# Patient Record
Sex: Female | Born: 1970 | Race: White | Hispanic: No | Marital: Married | Smoking: Never smoker
Health system: Southern US, Community
[De-identification: ages and names within clinical notes are randomized; demographics above are authoritative.]

## PROBLEM LIST (undated history)

## (undated) DIAGNOSIS — Z975 Presence of (intrauterine) contraceptive device: Secondary | ICD-10-CM

## (undated) DIAGNOSIS — E559 Vitamin D deficiency, unspecified: Secondary | ICD-10-CM

## (undated) HISTORY — PX: OTHER SURGICAL HISTORY: SHX169

## (undated) HISTORY — DX: Vitamin D deficiency, unspecified: E55.9

## (undated) HISTORY — DX: Presence of (intrauterine) contraceptive device: Z97.5

---

## 1999-05-21 ENCOUNTER — Other Ambulatory Visit: Admission: RE | Admit: 1999-05-21 | Discharge: 1999-05-21 | Payer: Self-pay | Admitting: Obstetrics and Gynecology

## 2000-06-02 ENCOUNTER — Other Ambulatory Visit: Admission: RE | Admit: 2000-06-02 | Discharge: 2000-06-02 | Payer: Self-pay | Admitting: Obstetrics and Gynecology

## 2001-07-07 ENCOUNTER — Other Ambulatory Visit: Admission: RE | Admit: 2001-07-07 | Discharge: 2001-07-07 | Payer: Self-pay | Admitting: Obstetrics and Gynecology

## 2001-10-13 HISTORY — PX: DILATION AND CURETTAGE OF UTERUS: SHX78

## 2002-07-23 ENCOUNTER — Encounter (INDEPENDENT_AMBULATORY_CARE_PROVIDER_SITE_OTHER): Payer: Self-pay | Admitting: *Deleted

## 2002-07-23 ENCOUNTER — Ambulatory Visit (HOSPITAL_COMMUNITY): Admission: RE | Admit: 2002-07-23 | Discharge: 2002-07-23 | Payer: Self-pay | Admitting: Obstetrics and Gynecology

## 2002-08-11 ENCOUNTER — Other Ambulatory Visit: Admission: RE | Admit: 2002-08-11 | Discharge: 2002-08-11 | Payer: Self-pay | Admitting: Obstetrics and Gynecology

## 2003-06-28 ENCOUNTER — Ambulatory Visit (HOSPITAL_COMMUNITY): Admission: RE | Admit: 2003-06-28 | Discharge: 2003-06-28 | Payer: Self-pay | Admitting: Obstetrics and Gynecology

## 2003-07-10 ENCOUNTER — Encounter: Payer: Self-pay | Admitting: Obstetrics and Gynecology

## 2003-07-10 ENCOUNTER — Ambulatory Visit (HOSPITAL_COMMUNITY): Admission: RE | Admit: 2003-07-10 | Discharge: 2003-07-10 | Payer: Self-pay | Admitting: Obstetrics and Gynecology

## 2003-09-11 ENCOUNTER — Inpatient Hospital Stay (HOSPITAL_COMMUNITY): Admission: AD | Admit: 2003-09-11 | Discharge: 2003-09-14 | Payer: Self-pay | Admitting: Obstetrics and Gynecology

## 2003-10-11 ENCOUNTER — Other Ambulatory Visit: Admission: RE | Admit: 2003-10-11 | Discharge: 2003-10-11 | Payer: Self-pay | Admitting: Obstetrics and Gynecology

## 2004-09-18 ENCOUNTER — Other Ambulatory Visit: Admission: RE | Admit: 2004-09-18 | Discharge: 2004-09-18 | Payer: Self-pay | Admitting: Obstetrics and Gynecology

## 2005-08-06 ENCOUNTER — Other Ambulatory Visit: Admission: RE | Admit: 2005-08-06 | Discharge: 2005-08-06 | Payer: Self-pay | Admitting: Obstetrics and Gynecology

## 2009-11-30 ENCOUNTER — Inpatient Hospital Stay (HOSPITAL_COMMUNITY): Admission: AD | Admit: 2009-11-30 | Discharge: 2009-11-30 | Payer: Self-pay | Admitting: Obstetrics and Gynecology

## 2009-11-30 ENCOUNTER — Ambulatory Visit: Payer: Self-pay | Admitting: Family

## 2009-12-09 ENCOUNTER — Inpatient Hospital Stay (HOSPITAL_COMMUNITY): Admission: AD | Admit: 2009-12-09 | Discharge: 2009-12-11 | Payer: Self-pay | Admitting: Obstetrics and Gynecology

## 2011-01-01 LAB — CBC
HCT: 38.1 % (ref 36.0–46.0)
MCHC: 34 g/dL (ref 30.0–36.0)
MCV: 97.9 fL (ref 78.0–100.0)
MCV: 99.2 fL (ref 78.0–100.0)
Platelets: 212 10*3/uL (ref 150–400)
RBC: 3.89 MIL/uL (ref 3.87–5.11)
RDW: 13.4 % (ref 11.5–15.5)

## 2011-01-01 LAB — RPR: RPR Ser Ql: NONREACTIVE

## 2011-01-01 LAB — RH IMMUNE GLOB WKUP(>/=20WKS)(NOT WOMEN'S HOSP): Fetal Screen: NEGATIVE

## 2011-02-28 NOTE — Op Note (Signed)
NAME:  Susan Powers, Susan Powers                            ACCOUNT NO.:  [id number]   MEDICAL RECORD NO.:  [id number]                   PATIENT TYPE:  AMB   LOCATION:  SDC                                  FACILITY:  [facility]   PHYSICIAN:  Randye Lobo, M.D.                DATE OF BIRTH:  [birth year]   DATE OF PROCEDURE:  07/23/2002  DATE OF DISCHARGE:                                 OPERATIVE REPORT   PREOPERATIVE DIAGNOSES:  1. Intrauterine gestation at 13+ 6 weeks.  2. Missed abortion.  3. Rh-negative status.   POSTOPERATIVE DIAGNOSES:  1. Intrauterine gestation at 13+ 6 weeks.  2. Missed abortion.  3. Rh-negative status.   PROCEDURE:  Dilatation and evacuation.   SURGEON:  Randye Lobo, M.D.   ANESTHESIA:  MAC, paracervical block with 1% lidocaine.   IV FLUIDS:  150 cc of lactated Ringers'.   ESTIMATED BLOOD LOSS:  Minimal.   URINE OUTPUT:  30 cc prior to the procedure.   COMPLICATIONS:  None.   INDICATIONS FOR PROCEDURE:  The patient was a 40 year old gravida 1, para 0,  who presented at 13+ 6 weeks' gestation by her last menstrual period of April 13, 2002, for a routine office visit on July 22, 2002, noting brown  vaginal discharge which has since resolved.  Fetal heart tones were not  present with an external Doppler and an ultrasound was therefore requested  which documented a missed abortion with a crown-rump length of 9+ 5 weeks'  and no fetal heart tones by Doppler ultrasound.  A discussion was held with  the patient regarding her diagnosis and the decision was made to proceed  with the dilatation and evacuation procedure after the risks, benefits, and  alternatives were discussed with her.  The patient's blood type was noted to  O-negative.   FINDINGS:  Examination under anesthesia revealed a 10-week size mid position  uterus.  No adnexal masses were appreciated.   The D&E procedure revealed a large quantity of products of conception which  were sent to  pathology.   SPECIMENS:  Products of conception was sent to pathology.   PROCEDURE:  An IV was started in the Day Surgical area and the patient did  receive Ancef 1 g intravenously.  The patient was then properly identified  and was taken to the operating room suite where MAC anesthesia was induced.  The patient was placed in the dorsal lithotomy position and the vagina and  perineum were then sterilely prepped and draped.  The bladder was sterilely  catheterized of urine.  Examination under anesthesia was performed and the  findings were as noted above.   A speculum was placed inside the vagina and a single-tooth tenaculum was  placed on the anterior cervical lip.  Paracervical block was performed with  a total of 10 cc 1% lidocaine in standard fashion.   The uterus  was sounded and then the cervix was sterilely dilated up to a 25  Pratt dilator.  A 10 suction tip curette was then entered in through the  cervix to the level of the uterine fundus and appropriate suction was  applied.  The suction tip was turned in a clockwise fashion while  withdrawing the curette.  This was repeated an additional three times and a  large amount of products of conception were obtained.  Both a sharp and then  a serrated curette were then used to curette all four quadrants until a  characteristic-like gritty texture was appreciated.  No remaining products  of conception were obtained.  The suction tip curette was introduced one  more time through the cervix to the level of the uterine fundus to remove  any remaining blood clots from within the uterine cavity.  Hemostasis was  noted to be excellent at this time and all vaginal instruments were removed.  The patient was cleansed of Betadine, awakened, and escorted to the recovery  room in stable condition.  There were no complications to the procedure.  All sponge, instrument, and needle counts were correct.                                                   Randye Lobo, M.D.    BES/MEDQ  D:  07/23/2002  T:  07/23/2002  Job:  [id number]

## 2011-05-21 ENCOUNTER — Other Ambulatory Visit: Payer: Self-pay | Admitting: Obstetrics and Gynecology

## 2012-07-15 ENCOUNTER — Encounter: Payer: Self-pay | Admitting: Internal Medicine

## 2012-07-15 ENCOUNTER — Ambulatory Visit (INDEPENDENT_AMBULATORY_CARE_PROVIDER_SITE_OTHER): Payer: BC Managed Care – PPO | Admitting: Internal Medicine

## 2012-07-15 VITALS — BP 120/70 | HR 72 | Temp 97.1°F | Resp 18 | Wt 146.0 lb

## 2012-07-15 DIAGNOSIS — Z87898 Personal history of other specified conditions: Secondary | ICD-10-CM

## 2012-07-15 DIAGNOSIS — M62838 Other muscle spasm: Secondary | ICD-10-CM

## 2012-07-15 DIAGNOSIS — Z23 Encounter for immunization: Secondary | ICD-10-CM

## 2012-07-15 DIAGNOSIS — Z8639 Personal history of other endocrine, nutritional and metabolic disease: Secondary | ICD-10-CM | POA: Insufficient documentation

## 2012-07-15 DIAGNOSIS — B359 Dermatophytosis, unspecified: Secondary | ICD-10-CM

## 2012-07-15 MED ORDER — IBUPROFEN 800 MG PO TABS: ORAL_TABLET | ORAL | Status: DC

## 2012-07-15 NOTE — Progress Notes (Signed)
  Subjective:    Patient ID: Susan Powers, female    DOB: 06-24-1971, 41 y.o.   MRN: [id number]  HPI New pt here for first visit.    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  She brings labs with her and vitamin D level 09/2011 was 29.3   She is concerned over thoracic back pain that  Began last week.  She felt a stinging sensation in upper back and now has tight shoulders and pain wakes her sometimes at nifght.  Had massage yesterday that helped n.  No numbness, tingling or weakness in arms.  She exercises frequetly with spin and running   No Known Allergies History reviewed. No pertinent past medical history. No past surgical history on file. History   Social History  . Marital Status: Married    Spouse Name: N/A    Number of Children: N/A  . Years of Education: N/A   Occupational History  . Not on file.   Social History Main Topics  . Smoking status: Never Smoker   . Smokeless tobacco: Not on file  . Alcohol Use: 0.5 - 1.0 oz/week    1-2 drink(s) per week  . Drug Use: No  . Sexually Active: Yes    Birth Control/ Protection: IUD   Other Topics Concern  . Not on file   Social History Narrative  . No narrative on file   Family History  Problem Relation Age of Onset  . Arthritis Mother   . Arthritis Paternal Grandmother    There is no problem list on file for this patient.  No current outpatient prescriptions on file prior to visit.      Review of Systems    see HPI Objective:   Physical Exam Physical Exam  Nursing note and vitals reviewed.  Constitutional: She is oriented to person, place, and time. She appears well-developed and well-nourished.  HENT:  Head: Normocephalic and atraumatic.  Cardiovascular: Normal rate and regular rhythm. Exam reveals no gallop and no friction rub.  No murmur heard.  Pulmonary/Chest:  Breath sounds normal. She has no wheezes. She has no rales. BAck  Muslce stiffness R thoracic region.    Neurological: She is alert and oriented to person, place, and time.  Skin: Skin is warm and dry.  Psychiatric: She has a normal mood and affect. Her behavior is normal.         Assessment & Plan:  History of Vitamin D deficiency:  Counseled of dosing of calcium and vitamin d daily  Thoracic muscle strain  Ibuprofen 800 mg bid for one week  Ice/Heat  Avoid upper body exercises for now  Schedule CPE  Influenza today

## 2012-07-15 NOTE — Patient Instructions (Addendum)
Take ibuprofen twice a day 800 mg for one week  Call if back not better  Schedule complete physical

## 2012-07-30 ENCOUNTER — Encounter: Payer: Self-pay | Admitting: *Deleted

## 2012-10-18 ENCOUNTER — Encounter: Payer: Self-pay | Admitting: Internal Medicine

## 2012-10-18 ENCOUNTER — Ambulatory Visit (INDEPENDENT_AMBULATORY_CARE_PROVIDER_SITE_OTHER): Payer: BC Managed Care – PPO | Admitting: Internal Medicine

## 2012-10-18 VITALS — BP 114/74 | HR 77 | Temp 97.2°F | Resp 18 | Wt 148.0 lb

## 2012-10-18 DIAGNOSIS — Z124 Encounter for screening for malignant neoplasm of cervix: Secondary | ICD-10-CM

## 2012-10-18 DIAGNOSIS — K469 Unspecified abdominal hernia without obstruction or gangrene: Secondary | ICD-10-CM | POA: Insufficient documentation

## 2012-10-18 DIAGNOSIS — E559 Vitamin D deficiency, unspecified: Secondary | ICD-10-CM

## 2012-10-18 DIAGNOSIS — Z0000 Encounter for general adult medical examination without abnormal findings: Secondary | ICD-10-CM

## 2012-10-18 DIAGNOSIS — R1033 Periumbilical pain: Secondary | ICD-10-CM

## 2012-10-18 DIAGNOSIS — Z1151 Encounter for screening for human papillomavirus (HPV): Secondary | ICD-10-CM

## 2012-10-18 DIAGNOSIS — F338 Other recurrent depressive disorders: Secondary | ICD-10-CM | POA: Insufficient documentation

## 2012-10-18 DIAGNOSIS — F39 Unspecified mood [affective] disorder: Secondary | ICD-10-CM

## 2012-10-18 DIAGNOSIS — K429 Umbilical hernia without obstruction or gangrene: Secondary | ICD-10-CM

## 2012-10-18 LAB — COMPREHENSIVE METABOLIC PANEL
ALT: 12 U/L (ref 0–35)
AST: 15 U/L (ref 0–37)
Albumin: 4.8 g/dL (ref 3.5–5.2)
Alkaline Phosphatase: 49 U/L (ref 39–117)
BUN: 11 mg/dL (ref 6–23)
CO2: 29 mEq/L (ref 19–32)
Calcium: 9.4 mg/dL (ref 8.4–10.5)
Glucose, Bld: 75 mg/dL (ref 70–99)
Total Protein: 7.1 g/dL (ref 6.0–8.3)

## 2012-10-18 LAB — LIPID PANEL
Cholesterol: 150 mg/dL (ref 0–200)
HDL: 42 mg/dL (ref >39)

## 2012-10-18 LAB — CBC WITH DIFFERENTIAL/PLATELET
Basophils Absolute: 0 10*3/uL (ref 0.0–0.1)
Eosinophils Relative: 2 % (ref 0–5)
HCT: 41.7 % (ref 36.0–46.0)
Hemoglobin: 14.3 g/dL (ref 12.0–15.0)
Lymphs Abs: 2.4 10*3/uL (ref 0.7–4.0)
MCHC: 34.3 g/dL (ref 30.0–36.0)
MCV: 93.7 fL (ref 78.0–100.0)
Monocytes Absolute: 0.3 10*3/uL (ref 0.1–1.0)
RBC: 4.45 MIL/uL (ref 3.87–5.11)
RDW: 12.8 % (ref 11.5–15.5)
WBC: 6.4 10*3/uL (ref 4.0–10.5)

## 2012-10-18 LAB — POCT URINALYSIS DIPSTICK
Blood, UA: NEGATIVE
Leukocytes, UA: NEGATIVE
Nitrite, UA: NEGATIVE
Protein, UA: NEGATIVE
Spec Grav, UA: <=1.005
Urobilinogen, UA: NEGATIVE

## 2012-10-18 MED ORDER — BUPROPION HCL ER (SR) 100 MG PO TB12: ORAL_TABLET | ORAL | Status: DC

## 2012-10-18 NOTE — Progress Notes (Signed)
Subjective:    Patient ID: Susan Powers, female    DOB: 1971-09-11, 42 y.o.   MRN: [id number]  HPI Susan Powers is here for CPE.  Reports her back is much better  She reports she has a hernia bulge that she feels near her belly button.  Bulge moves in and out easily but at times she feels pain.  No obvious bulging today no pain today.  LBM yesterday  Passing flatus  She also reports she has been feeling "blue"  No energy, sad most days,  No motivation.  Negative for anhedonia,  Concentration,  appetitie ok  Wants to sleep all the time .  She notes this has happened every winter for the last 3 winters.    Tearful when describing her sad mood.  She denies S/H ideation and no psychotic features.  NO FH of depression  No Known Allergies History reviewed. No pertinent past medical history. History reviewed. No pertinent past surgical history. History   Social History  . Marital Status: Married    Spouse Name: N/A    Number of Children: N/A  . Years of Education: N/A   Occupational History  . Not on file.   Social History Main Topics  . Smoking status: Never Smoker   . Smokeless tobacco: Not on file  . Alcohol Use: 0.5 - 1.0 oz/week    1-2 drink(s) per week  . Drug Use: No  . Sexually Active: Yes    Birth Control/ Protection: IUD   Other Topics Concern  . Not on file   Social History Narrative  . No narrative on file   Family History  Problem Relation Age of Onset  . Arthritis Mother   . Arthritis Paternal Grandmother    Patient Active Problem List  Diagnosis  . H/O vitamin D deficiency  . Muscle spasm  . Tinea  . Seasonal affective disorder  . Abdominal hernia   Current Outpatient Prescriptions on File Prior to Visit  Medication Sig Dispense Refill  . buPROPion (WELLBUTRIN SR) 100 MG 12 hr tablet Take one tablet daily  30 tablet  2  . ibuprofen (ADVIL,MOTRIN) 800 MG tablet One po bid for one week  21 tablet  0       Review of Systems  Constitutional: Negative.     HENT: Negative.   Eyes: Negative.   Respiratory: Negative.   Cardiovascular: Negative.   Gastrointestinal: Negative.   Genitourinary: Negative.   Musculoskeletal: Negative.   Skin: Negative.   Neurological: Negative.   Hematological: Negative.   Psychiatric/Behavioral: Positive for dysphoric mood. Negative for suicidal ideas and self-injury.      see HPI   Objective:   Physical Exam  Physical Exam  Vital signs and nursing note reviewed  Constitutional: She is oriented to person, place, and time. She appears well-developed and well-nourished. She is cooperative.  HENT:  Head: Normocephalic and atraumatic.  Right Ear: Tympanic membrane normal.  Left Ear: Tympanic membrane normal.  Nose: Nose normal.  Mouth/Throat: Oropharynx is clear and moist and mucous membranes are normal. No oropharyngeal exudate or posterior oropharyngeal erythema.  Eyes: Conjunctivae and EOM are normal. Pupils are equal, round, and reactive to light.  Neck: Neck supple. No JVD present. Carotid bruit is not present. No mass and no thyromegaly present.  Cardiovascular: Regular rhythm, normal heart sounds, intact distal pulses and normal pulses.  Exam reveals no gallop and no friction rub.   No murmur heard. Pulses:      Dorsalis pedis pulses  are 2+ on the right side, and 2+ on the left side.  Pulmonary/Chest: Breath sounds normal. She has no wheezes. She has no rhonchi. She has no rales. Right breast exhibits no mass, no nipple discharge and no skin change. Left breast exhibits no mass, no nipple discharge and no skin change.  Abdominal: Soft. Bowel sounds are normal. She exhibits no distension and no mass. There is no hepatosplenomegaly. There is no tenderness. There is no CVA tenderness.  Genitourinary: Rectum normal, vagina normal and uterus normal. Rectal exam shows no mass. No labial fusion. There is no lesion on the right labia. There is no lesion on the left labia. Cervix exhibits no motion tenderness.  Right adnexum displays no mass, no tenderness and no fullness. Left adnexum displays no mass, no tenderness and no fullness. No erythema around the vagina.  Musculoskeletal:       No active synovitis to any joint.    Lymphadenopathy:       Right cervical: No superficial cervical adenopathy present.      Left cervical: No superficial cervical adenopathy present.       Right axillary: No pectoral and no lateral adenopathy present.       Left axillary: No pectoral and no lateral adenopathy present.      Right: No inguinal adenopathy present.       Left: No inguinal adenopathy present.  Neurological: She is alert and oriented to person, place, and time. She has normal strength and normal reflexes. No cranial nerve deficit or sensory deficit. She displays a negative Romberg sign. Coordination and gait normal.  Skin: Skin is warm and dry. No abrasion, no bruising, no ecchymosis and no rash noted. No cyanosis. Nails show no clubbing.  Psychiatric: She has a normal mood and affect. Her speech is normal and behavior is normal.          Assessment & Plan:  Uuuuuuu  Health Maintenance\ Up to date with vaccines.  Mammogram done 02/13/2012 [place] comprehensive. Pap today.  See scanned sheet  abd wall hernia:  Will get CT abd/pelvis with contrast  Seasonal affective disorder  Pt desires to try a med.  Will give Wellbutrin 100 mg SR daily.  She is to see me in 4-6 weeks.  Discussed SE profle.  No history of seizure disorder.  Vitamin D deficiency will check today  Back strain  improved       Assessment & Plan:

## 2012-10-18 NOTE — Patient Instructions (Addendum)
See me in 4-6 weeks

## 2012-10-19 ENCOUNTER — Ambulatory Visit (HOSPITAL_BASED_OUTPATIENT_CLINIC_OR_DEPARTMENT_OTHER): Payer: BC Managed Care – PPO

## 2012-10-19 ENCOUNTER — Telehealth: Payer: Self-pay | Admitting: *Deleted

## 2012-10-19 ENCOUNTER — Encounter: Payer: Self-pay | Admitting: *Deleted

## 2012-10-19 LAB — VITAMIN D 25 HYDROXY (VIT D DEFICIENCY, FRACTURES): Vit D, 25-Hydroxy: 31 ng/mL (ref 30–89)

## 2012-10-26 ENCOUNTER — Encounter: Payer: Self-pay | Admitting: *Deleted

## 2012-10-28 NOTE — Telephone Encounter (Signed)
error 

## 2012-11-19 ENCOUNTER — Encounter: Payer: Self-pay | Admitting: *Deleted

## 2012-11-22 ENCOUNTER — Encounter: Payer: Self-pay | Admitting: Internal Medicine

## 2012-11-22 ENCOUNTER — Ambulatory Visit (INDEPENDENT_AMBULATORY_CARE_PROVIDER_SITE_OTHER): Payer: BC Managed Care – PPO | Admitting: Internal Medicine

## 2012-11-22 VITALS — BP 113/75 | HR 82 | Temp 97.7°F | Resp 18 | Wt 148.0 lb

## 2012-11-22 DIAGNOSIS — K469 Unspecified abdominal hernia without obstruction or gangrene: Secondary | ICD-10-CM

## 2012-11-22 DIAGNOSIS — F39 Unspecified mood [affective] disorder: Secondary | ICD-10-CM

## 2012-11-22 DIAGNOSIS — F338 Other recurrent depressive disorders: Secondary | ICD-10-CM

## 2012-11-22 NOTE — Patient Instructions (Addendum)
See me as needed 

## 2012-11-22 NOTE — Progress Notes (Signed)
  Subjective:    Patient ID: Susan Powers, female    DOB: [birth year], 42 y.o.   MRN: [id number]  HPI Halo is here for follow up.    SAD:  She reports 60-70% improvement with Wellbutrin.  Mood brighter, concentration better, more focused No S/H ideation  Umbilical hernia.  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 .  No pain ,  Normal pattern to BM's  She would eventually like a surgeon to evaluate her but wants to check with insurance first  No Known Allergies History reviewed. No pertinent past medical history. History reviewed. No pertinent past surgical history. History   Social History  . Marital Status: Married    Spouse Name: N/A    Number of Children: N/A  . Years of Education: N/A   Occupational History  . Not on file.   Social History Main Topics  . Smoking status: Never Smoker   . Smokeless tobacco: Not on file  . Alcohol Use: .5 - 1 oz/week    1-2 drink(s) per week  . Drug Use: No  . Sexually Active: Yes    Birth Control/ Protection: IUD   Other Topics Concern  . Not on file   Social History Narrative  . No narrative on file   Family History  Problem Relation Age of Onset  . Arthritis Mother   . Arthritis Paternal Grandmother    Patient Active Problem List  Diagnosis  . H/O vitamin D deficiency  . Muscle spasm  . Tinea  . Seasonal affective disorder  . Abdominal hernia   Current Outpatient Prescriptions on File Prior to Visit  Medication Sig Dispense Refill  . buPROPion (WELLBUTRIN SR) 100 MG 12 hr tablet Take one tablet daily  30 tablet  2  . ibuprofen (ADVIL,MOTRIN) 800 MG tablet One po bid for one week  21 tablet  0   No current facility-administered medications on file prior to visit.          Review of Systems See HPI    Objective:   Physical Exam  Physical Exam  Nursing note and vitals reviewed.  Constitutional: She is oriented to person, place, and time. She appears well-developed and  well-nourished.  HENT:  Head: Normocephalic and atraumatic.  Cardiovascular: Normal rate and regular rhythm. Exam reveals no gallop and no friction rub.  No murmur heard.  Pulmonary/Chest: Breath sounds normal. She has no wheezes. She has no rales.  Neurological: She is alert and oriented to person, place, and time.  Skin: Skin is warm and dry.  Psychiatric: She has a normal mood and affect. Her behavior is normal.            Assessment & Plan:  SAD:  Continue WEllbutrin for now.  Re-evaluarte in 6 months.    Umbilical hernia  Ok for surgical referral but pt wishes to check with insurance first .  She will calloffice when ready.  Counsleed if any pain or change in bowel pattern to call office

## 2013-02-14 ENCOUNTER — Encounter: Payer: Self-pay | Admitting: *Deleted

## 2013-03-04 ENCOUNTER — Other Ambulatory Visit: Payer: Self-pay | Admitting: Internal Medicine

## 2013-03-04 NOTE — Telephone Encounter (Signed)
Refill request

## 2013-04-11 ENCOUNTER — Ambulatory Visit: Payer: BC Managed Care – PPO | Admitting: Internal Medicine

## 2013-04-12 ENCOUNTER — Ambulatory Visit (INDEPENDENT_AMBULATORY_CARE_PROVIDER_SITE_OTHER): Payer: BC Managed Care – PPO | Admitting: Internal Medicine

## 2013-04-12 ENCOUNTER — Encounter: Payer: Self-pay | Admitting: Internal Medicine

## 2013-04-12 VITALS — BP 105/63 | HR 87 | Temp 97.9°F | Resp 18 | Wt 149.0 lb

## 2013-04-12 DIAGNOSIS — F4323 Adjustment disorder with mixed anxiety and depressed mood: Secondary | ICD-10-CM | POA: Insufficient documentation

## 2013-04-12 DIAGNOSIS — L304 Erythema intertrigo: Secondary | ICD-10-CM

## 2013-04-12 DIAGNOSIS — L538 Other specified erythematous conditions: Secondary | ICD-10-CM

## 2013-04-12 MED ORDER — NYSTATIN-TRIAMCINOLONE 100000-0.1 UNIT/GM-% EX OINT: TOPICAL_OINTMENT | Freq: Two times a day (BID) | CUTANEOUS | Status: DC

## 2013-04-12 NOTE — Patient Instructions (Addendum)
See me as needed 

## 2013-04-12 NOTE — Progress Notes (Signed)
  Subjective:    Patient ID: Susan Powers, female    DOB: 01/30/1971, 42 y.o.   MRN: [id number]  HPI  [name] is here for follow up.  She has been on Wellbutrin for 6 months now.  She tells me that when she forgets to take her med she can feel her sad symptoms return  No SI/HI  She also has itchy rash top of both thighs.  No vaginal discharge  No Known Allergies History reviewed. No pertinent past medical history. History reviewed. No pertinent past surgical history. History   Social History  . Marital Status: Married    Spouse Name: N/A    Number of Children: N/A  . Years of Education: N/A   Occupational History  . Not on file.   Social History Main Topics  . Smoking status: Never Smoker   . Smokeless tobacco: Not on file  . Alcohol Use: .5 - 1 oz/week    1-2 drink(s) per week  . Drug Use: No  . Sexually Active: Yes    Birth Control/ Protection: IUD   Other Topics Concern  . Not on file   Social History Narrative  . No narrative on file   Family History  Problem Relation Age of Onset  . Arthritis Mother   . Arthritis Paternal Grandmother    Patient Active Problem List   Diagnosis Date Noted  . Adjustment disorder with mixed anxiety and depressed mood 04/12/2013  . Seasonal affective disorder 10/18/2012  . Abdominal hernia 10/18/2012  . H/O vitamin D deficiency 07/15/2012  . Muscle spasm 07/15/2012  . Tinea 07/15/2012   Current Outpatient Prescriptions on File Prior to Visit  Medication Sig Dispense Refill  . buPROPion (WELLBUTRIN SR) 100 MG 12 hr tablet TAKE 1 TABLET BY MOUTH ONCE DAILY  30 tablet  2  . ibuprofen (ADVIL,MOTRIN) 800 MG tablet One po bid for one week  21 tablet  0   No current facility-administered medications on file prior to visit.      Review of Systems See HPI    Objective:   Physical Exam Physical Exam  Nursing note and vitals reviewed.  Constitutional: She is oriented to person, place, and time. She appears well-developed and  well-nourished.  HENT:  Head: Normocephalic and atraumatic.  Cardiovascular: Normal rate and regular rhythm. Exam reveals no gallop and no friction rub.  No murmur heard.  Pulmonary/Chest: Breath sounds normal. She has no wheezes. She has no rales.  Neurological: She is alert and oriented to person, place, and time.  No tremor Skin: Skin is warm and dry. Perineal area  She has reddened rash with satellite lesions at top of thighs EXT:  No  edema  Psychiatric: She has a normal mood and affect. Her behavior is normal.        Assessment & Plan:  Adjustment disorder with depressed mood:  Will continue Wellbutrin 100 mg.  Intertrigo:  Will give Mycolog.  Bid  See me as needed

## 2013-06-15 ENCOUNTER — Other Ambulatory Visit: Payer: Self-pay | Admitting: Internal Medicine

## 2013-06-17 ENCOUNTER — Other Ambulatory Visit: Payer: Self-pay | Admitting: *Deleted

## 2013-06-17 NOTE — Telephone Encounter (Signed)
Pt requesting refill

## 2013-06-19 MED ORDER — NYSTATIN-TRIAMCINOLONE 100000-0.1 UNIT/GM-% EX OINT: TOPICAL_OINTMENT | Freq: Two times a day (BID) | CUTANEOUS | Status: DC

## 2013-09-01 ENCOUNTER — Other Ambulatory Visit: Payer: Self-pay | Admitting: *Deleted

## 2013-09-01 DIAGNOSIS — Z139 Encounter for screening, unspecified: Secondary | ICD-10-CM

## 2013-09-01 DIAGNOSIS — R922 Inconclusive mammogram: Secondary | ICD-10-CM

## 2013-09-19 ENCOUNTER — Encounter: Payer: Self-pay | Admitting: Internal Medicine

## 2013-09-19 ENCOUNTER — Ambulatory Visit (INDEPENDENT_AMBULATORY_CARE_PROVIDER_SITE_OTHER): Payer: BC Managed Care – PPO | Admitting: Internal Medicine

## 2013-09-19 VITALS — BP 117/77 | HR 82 | Temp 97.6°F | Resp 18

## 2013-09-19 DIAGNOSIS — F338 Other recurrent depressive disorders: Secondary | ICD-10-CM

## 2013-09-19 DIAGNOSIS — F39 Unspecified mood [affective] disorder: Secondary | ICD-10-CM

## 2013-09-19 DIAGNOSIS — T753XXA Motion sickness, initial encounter: Secondary | ICD-10-CM

## 2013-09-19 DIAGNOSIS — F4323 Adjustment disorder with mixed anxiety and depressed mood: Secondary | ICD-10-CM

## 2013-09-19 MED ORDER — PROMETHAZINE HCL 25 MG PO TABS: 25.0000 mg | ORAL_TABLET | Freq: Three times a day (TID) | ORAL | Status: DC | PRN

## 2013-09-19 MED ORDER — SCOPOLAMINE 1 MG/3DAYS TD PT72: 1.0000 | MEDICATED_PATCH | TRANSDERMAL | Status: DC

## 2013-09-19 MED ORDER — BUPROPION HCL ER (SR) 100 MG PO TB12: ORAL_TABLET | ORAL | Status: DC

## 2013-09-19 NOTE — Patient Instructions (Signed)
See me as needed 

## 2013-09-19 NOTE — Progress Notes (Signed)
   Subjective:    Patient ID: Susan Powers, female    DOB: 10/21/1970, 42 y.o.   MRN: [id number]  HPI  Susan Powers is here for acute visit.  She is going on a cruise and would like to have a few scopolamine patches and phenergan tablets as she gets severe motion sickness        She tells me her wellbutrin is helping well  And she would like to continue  No Known Allergies History reviewed. No pertinent past medical history. History reviewed. No pertinent past surgical history. History   Social History  . Marital Status: Married    Spouse Name: N/A    Number of Children: N/A  . Years of Education: N/A   Occupational History  . Not on file.   Social History Main Topics  . Smoking status: Never Smoker   . Smokeless tobacco: Not on file  . Alcohol Use: .5 - 1 oz/week    1-2 drink(s) per week  . Drug Use: No  . Sexual Activity: Yes    Birth Control/ Protection: IUD   Other Topics Concern  . Not on file   Social History Narrative  . No narrative on file   Family History  Problem Relation Age of Onset  . Arthritis Mother   . Arthritis Paternal Grandmother    Patient Active Problem List   Diagnosis Date Noted  . Adjustment disorder with mixed anxiety and depressed mood 04/12/2013  . Seasonal affective disorder 10/18/2012  . Abdominal hernia 10/18/2012  . H/O vitamin D deficiency 07/15/2012  . Muscle spasm 07/15/2012  . Tinea 07/15/2012   Current Outpatient Prescriptions on File Prior to Visit  Medication Sig Dispense Refill  . buPROPion (WELLBUTRIN SR) 100 MG 12 hr tablet TAKE 1 TABLET BY MOUTH ONCE DAILY  30 tablet  2  . ibuprofen (ADVIL,MOTRIN) 800 MG tablet One po bid for one week  21 tablet  0  . nystatin-triamcinolone ointment (MYCOLOG) Apply topically 2 (two) times daily.  30 g  0   No current facility-administered medications on file prior to visit.      Review of Systems See HPI    Objective:   Physical Exam  Physical Exam  Nursing note and vitals  reviewed.  Constitutional: She is oriented to person, place, and time. She appears well-developed and well-nourished.  HENT:  Head: Normocephalic and atraumatic.  Cardiovascular: Normal rate and regular rhythm. Exam reveals no gallop and no friction rub.  No murmur heard.  Pulmonary/Chest: Breath sounds normal. She has no wheezes. She has no rales.  Neurological: She is alert and oriented to person, place, and time.  Skin: Skin is warm and dry.  Psychiatric: She has a normal mood and affect. Her behavior is normal.       Assessment & Plan:  Seasonal  Affective disorder.  Continue Wellbutrin  Motion sickness  :  OK to have a few scopolamine patches and phenergan  Advised to try OTC dramamine begin 24 hours prior to get on boat.   Advised not to take dramamine with scopolamine togetther

## 2013-10-14 ENCOUNTER — Other Ambulatory Visit: Payer: Self-pay | Admitting: Internal Medicine

## 2013-10-20 ENCOUNTER — Other Ambulatory Visit: Payer: Self-pay | Admitting: *Deleted

## 2013-10-20 MED ORDER — NYSTATIN 100000 UNIT/GM EX CREA: 1.0000 "application " | TOPICAL_CREAM | Freq: Two times a day (BID) | CUTANEOUS | Status: DC

## 2013-10-20 MED ORDER — TRIAMCINOLONE ACETONIDE 0.1 % EX CREA: 1.0000 "application " | TOPICAL_CREAM | Freq: Two times a day (BID) | CUTANEOUS | Status: DC

## 2013-10-20 NOTE — Telephone Encounter (Signed)
Refill request

## 2013-11-09 ENCOUNTER — Ambulatory Visit
Admission: RE | Admit: 2013-11-09 | Discharge: 2013-11-09 | Disposition: A | Discharge status: Home / Self Care | Payer: BC Managed Care – PPO | Source: Ambulatory Visit | Attending: Internal Medicine | Admitting: Internal Medicine

## 2013-11-09 DIAGNOSIS — Z139 Encounter for screening, unspecified: Secondary | ICD-10-CM

## 2013-11-09 DIAGNOSIS — R922 Inconclusive mammogram: Secondary | ICD-10-CM

## 2013-12-02 ENCOUNTER — Other Ambulatory Visit: Payer: Self-pay | Admitting: *Deleted

## 2013-12-02 DIAGNOSIS — Z0000 Encounter for general adult medical examination without abnormal findings: Secondary | ICD-10-CM

## 2013-12-05 LAB — CBC WITH DIFFERENTIAL/PLATELET
BASOS PCT: 0 % (ref 0–1)
Basophils Absolute: 0 10*3/uL (ref 0.0–0.1)
Eosinophils Absolute: 0.1 10*3/uL (ref 0.0–0.7)
Eosinophils Relative: 2 % (ref 0–5)
HCT: 40.5 % (ref 36.0–46.0)
HEMOGLOBIN: 13.8 g/dL (ref 12.0–15.0)
Lymphocytes Relative: 34 % (ref 12–46)
Lymphs Abs: 2.2 10*3/uL (ref 0.7–4.0)
MCH: 32.3 pg (ref 26.0–34.0)
MCHC: 34.1 g/dL (ref 30.0–36.0)
MCV: 94.8 fL (ref 78.0–100.0)
Monocytes Absolute: 0.4 10*3/uL (ref 0.1–1.0)
Monocytes Relative: 6 % (ref 3–12)
NEUTROS ABS: 3.7 10*3/uL (ref 1.7–7.7)
NEUTROS PCT: 58 % (ref 43–77)
Platelets: 235 10*3/uL (ref 150–400)
RBC: 4.27 MIL/uL (ref 3.87–5.11)
RDW: 12.9 % (ref 11.5–15.5)
WBC: 6.4 10*3/uL (ref 4.0–10.5)

## 2013-12-06 ENCOUNTER — Encounter: Payer: Self-pay | Admitting: *Deleted

## 2013-12-06 LAB — COMPREHENSIVE METABOLIC PANEL
ALBUMIN: 4.2 g/dL (ref 3.5–5.2)
ALT: 9 U/L (ref 0–35)
AST: 13 U/L (ref 0–37)
Alkaline Phosphatase: 49 U/L (ref 39–117)
BUN: 12 mg/dL (ref 6–23)
CALCIUM: 8.7 mg/dL (ref 8.4–10.5)
CHLORIDE: 103 meq/L (ref 96–112)
CO2: 24 meq/L (ref 19–32)
Creat: 0.69 mg/dL (ref 0.50–1.10)
Glucose, Bld: 89 mg/dL (ref 70–99)
POTASSIUM: 4 meq/L (ref 3.5–5.3)
Sodium: 137 mEq/L (ref 135–145)
Total Bilirubin: 0.5 mg/dL (ref 0.2–1.2)
Total Protein: 6.6 g/dL (ref 6.0–8.3)

## 2013-12-06 LAB — LIPID PANEL
CHOLESTEROL: 119 mg/dL (ref 0–200)
HDL: 37 mg/dL — ABNORMAL LOW (ref >39)
LDL Cholesterol: 69 mg/dL (ref 0–99)
Total CHOL/HDL Ratio: 3.2 Ratio
Triglycerides: 64 mg/dL (ref <150)
VLDL: 13 mg/dL (ref 0–40)

## 2013-12-06 LAB — VITAMIN D 25 HYDROXY (VIT D DEFICIENCY, FRACTURES): Vit D, 25-Hydroxy: 37 ng/mL (ref 30–89)

## 2013-12-06 LAB — TSH: TSH: 2.078 u[IU]/mL (ref 0.350–4.500)

## 2013-12-07 ENCOUNTER — Ambulatory Visit (INDEPENDENT_AMBULATORY_CARE_PROVIDER_SITE_OTHER): Payer: BC Managed Care – PPO | Admitting: Internal Medicine

## 2013-12-07 ENCOUNTER — Encounter: Payer: Self-pay | Admitting: Internal Medicine

## 2013-12-07 VITALS — BP 123/72 | HR 87 | Resp 18 | Ht 67.0 in | Wt 154.0 lb

## 2013-12-07 DIAGNOSIS — Z0000 Encounter for general adult medical examination without abnormal findings: Secondary | ICD-10-CM

## 2013-12-07 DIAGNOSIS — M62838 Other muscle spasm: Secondary | ICD-10-CM

## 2013-12-07 DIAGNOSIS — E559 Vitamin D deficiency, unspecified: Secondary | ICD-10-CM

## 2013-12-07 DIAGNOSIS — F39 Unspecified mood [affective] disorder: Secondary | ICD-10-CM

## 2013-12-07 DIAGNOSIS — F4323 Adjustment disorder with mixed anxiety and depressed mood: Secondary | ICD-10-CM

## 2013-12-07 DIAGNOSIS — F338 Other recurrent depressive disorders: Secondary | ICD-10-CM

## 2013-12-07 DIAGNOSIS — R21 Rash and other nonspecific skin eruption: Secondary | ICD-10-CM

## 2013-12-07 MED ORDER — FLUCONAZOLE 150 MG PO TABS: ORAL_TABLET | ORAL | Status: DC

## 2013-12-07 MED ORDER — NABUMETONE 500 MG PO TABS
ORAL_TABLET | ORAL | Status: DC
Start: 2013-12-07 — End: 2014-07-19

## 2013-12-07 NOTE — Patient Instructions (Signed)
Will set up referral  To dermatology   Go to Pharmacy today  Take medicine as prescribed    See me in 3-4 weeks

## 2013-12-07 NOTE — Progress Notes (Signed)
Subjective:    Patient ID: Susan Powers, female    DOB: 09/05/1971, 43 y.o.   MRN: [id number]  HPI Susan Powers is here for CPE  Reports she has pain and stiffness in muscles of both shoulders  Upper trapezius area.   No pain in shoulders or along clavicle.    Denies injury or trauma.   Does lots of computer work  She still has rash inner thighs that itches .  Using Mycolog  Bid.     Wears cotton underwear.   No Known Allergies History reviewed. No pertinent past medical history. History reviewed. No pertinent past surgical history. History   Social History  . Marital Status: Married    Spouse Name: N/A    Number of Children: N/A  . Years of Education: N/A   Occupational History  . Not on file.   Social History Main Topics  . Smoking status: Never Smoker   . Smokeless tobacco: Not on file  . Alcohol Use: .5 - 1 oz/week    1-2 drink(s) per week  . Drug Use: No  . Sexual Activity: Yes    Birth Control/ Protection: IUD   Other Topics Concern  . Not on file   Social History Narrative  . No narrative on file   Family History  Problem Relation Age of Onset  . Arthritis Mother   . Arthritis Paternal Grandmother    Patient Active Problem List   Diagnosis Date Noted  . Adjustment disorder with mixed anxiety and depressed mood 04/12/2013  . Seasonal affective disorder 10/18/2012  . Abdominal hernia 10/18/2012  . H/O vitamin D deficiency 07/15/2012  . Muscle spasm 07/15/2012  . Tinea 07/15/2012   Current Outpatient Prescriptions on File Prior to Visit  Medication Sig Dispense Refill  . buPROPion (WELLBUTRIN SR) 100 MG 12 hr tablet Take one tablet twice a day  60 tablet  5  . ibuprofen (ADVIL,MOTRIN) 800 MG tablet One po bid for one week  21 tablet  0  . nystatin cream (MYCOSTATIN) Apply 1 application topically 2 (two) times daily.  30 g  0  . triamcinolone cream (KENALOG) 0.1 % Apply 1 application topically 2 (two) times daily.  30 g  0   No current facility-administered  medications on file prior to visit.       Review of Systems See HPI    Objective:   Physical Exam Physical Exam  Nursing note and vitals reviewed.  Constitutional: She is oriented to person, place, and time. She appears well-developed and well-nourished.  HENT:  Head: Normocephalic and atraumatic.  Right Ear: Tympanic membrane and ear canal normal. No drainage. Tympanic membrane is not injected and not erythematous.  Left Ear: Tympanic membrane and ear canal normal. No drainage. Tympanic membrane is not injected and not erythematous.  Nose: Nose normal. Right sinus exhibits no maxillary sinus tenderness and no frontal sinus tenderness. Left sinus exhibits no maxillary sinus tenderness and no frontal sinus tenderness.  Mouth/Throat: Oropharynx is clear and moist. No oral lesions. No oropharyngeal exudate.  Eyes: Conjunctivae and EOM are normal. Pupils are equal, round, and reactive to light.  Neck: Normal range of motion. Neck supple. No JVD present. Carotid bruit is not present. No mass and no thyromegaly present.  Cardiovascular: Normal rate, regular rhythm, S1 normal, S2 normal and intact distal pulses. Exam reveals no gallop and no friction rub.  No murmur heard.  Pulses:  Carotid pulses are 2+ on the right side, and 2+ on the  left side.  Dorsalis pedis pulses are 2+ on the right side, and 2+ on the left side.  No carotid bruit. No LE edema  Pulmonary/Chest: Breath sounds normal. She has no wheezes. She has no rales. She exhibits no tenderness.   Breast no discrete mass no nipple discharge no axillary adenopathy bilaterally Abdominal: Soft. Bowel sounds are normal. She exhibits no distension and no mass. There is no hepatosplenomegaly. There is no tenderness. There is no CVA tenderness.  Musculoskeletal: Normal range of motion.  No active synovitis to joints.  Stiffness to upper trapezius muscles. Good ROm and nontender in shoulders Lymphadenopathy:  She has no cervical  adenopathy.  She has no axillary adenopathy.  Right: No inguinal and no supraclavicular adenopathy present.  Left: No inguinal and no supraclavicular adenopathy present.  Neurological: She is alert and oriented to person, place, and time. She has normal strength and normal reflexes. She displays no tremor. No cranial nerve deficit or sensory deficit. Coordination and gait normal.  Skin: Skin is warm and dry. No rash noted. No cyanosis. Nails show no clubbing.  Psychiatric: She has a normal mood and affect. Her speech is normal and behavior is normal. Cognition and memory are normal.           Assessment & Plan:  Healht Maintenance  See scannned sheet  UTD with mm vaccines  Trapezius pain  She does not with muscle relaxants OK for massage and relafen bid for 10 days  See me in 4 weeks to reassess  siturational  Anxiety/depression  Continue wellbutrin  RAsh  Will give Diflucan  150 mg daily for 5 days  Ok to go to derm  Will set up referral

## 2014-01-03 ENCOUNTER — Other Ambulatory Visit: Payer: Self-pay | Admitting: *Deleted

## 2014-01-03 NOTE — Telephone Encounter (Signed)
Refill request

## 2014-01-04 ENCOUNTER — Ambulatory Visit: Payer: BC Managed Care – PPO | Admitting: Internal Medicine

## 2014-01-11 ENCOUNTER — Ambulatory Visit: Payer: BC Managed Care – PPO | Admitting: Internal Medicine

## 2014-02-27 ENCOUNTER — Other Ambulatory Visit: Payer: Self-pay | Admitting: Internal Medicine

## 2014-02-28 ENCOUNTER — Encounter: Payer: Self-pay | Admitting: *Deleted

## 2014-03-02 ENCOUNTER — Other Ambulatory Visit: Payer: Self-pay | Admitting: *Deleted

## 2014-03-02 NOTE — Telephone Encounter (Signed)
Refill request

## 2014-03-03 ENCOUNTER — Other Ambulatory Visit: Payer: Self-pay | Admitting: *Deleted

## 2014-03-03 MED ORDER — FLUCONAZOLE 150 MG PO TABS: ORAL_TABLET | ORAL | Status: DC

## 2014-03-05 NOTE — Telephone Encounter (Signed)
Does she need a refill.  I do not see the medication request

## 2014-03-07 ENCOUNTER — Other Ambulatory Visit: Payer: Self-pay | Admitting: *Deleted

## 2014-05-15 ENCOUNTER — Other Ambulatory Visit: Payer: Self-pay | Admitting: Internal Medicine

## 2014-05-15 ENCOUNTER — Telehealth: Payer: Self-pay | Admitting: *Deleted

## 2014-05-15 DIAGNOSIS — B379 Candidiasis, unspecified: Secondary | ICD-10-CM

## 2014-05-15 MED ORDER — FLUCONAZOLE 150 MG PO TABS: ORAL_TABLET | ORAL | Status: DC

## 2014-05-15 NOTE — Telephone Encounter (Signed)
Sent refill of Diflucan to pharm

## 2014-05-15 NOTE — Telephone Encounter (Signed)
Requested Medications     Medication name:  Name from pharmacy:  fluconazole (DIFLUCAN) 150 MG tablet  FLUCONAZOLE 150 MG TABLET 150 MG TAB    Sig: TAKE ONE TABLET BY MOUTH DAILY FOR 5 DAYS    Dispense: 5 tablet Refills: 0 Start: 05/15/2014  Class: Normal    Requested on: 03/03/2014    Originally ordered on: 12/07/2013 Last refill: 03/03/2014 Order History and Details          Last office visit 2/25 & last refill 5/22 no upcoming appt

## 2014-07-18 ENCOUNTER — Ambulatory Visit (INDEPENDENT_AMBULATORY_CARE_PROVIDER_SITE_OTHER): Payer: BC Managed Care – PPO | Admitting: *Deleted

## 2014-07-18 DIAGNOSIS — Z23 Encounter for immunization: Secondary | ICD-10-CM | POA: Diagnosis not present

## 2014-07-19 ENCOUNTER — Encounter: Payer: Self-pay | Admitting: Obstetrics and Gynecology

## 2014-07-19 ENCOUNTER — Ambulatory Visit (INDEPENDENT_AMBULATORY_CARE_PROVIDER_SITE_OTHER): Payer: BC Managed Care – PPO | Admitting: Obstetrics and Gynecology

## 2014-07-19 VITALS — BP 120/70 | HR 70 | Resp 14 | Ht 66.75 in | Wt 151.2 lb

## 2014-07-19 DIAGNOSIS — Z0000 Encounter for general adult medical examination without abnormal findings: Secondary | ICD-10-CM

## 2014-07-19 DIAGNOSIS — Z01419 Encounter for gynecological examination (general) (routine) without abnormal findings: Secondary | ICD-10-CM

## 2014-07-19 LAB — POCT URINALYSIS DIPSTICK
BILIRUBIN UA: NEGATIVE
Blood, UA: NEGATIVE
GLUCOSE UA: NEGATIVE
Ketones, UA: NEGATIVE
Leukocytes, UA: NEGATIVE
Nitrite, UA: NEGATIVE
Protein, UA: NEGATIVE
Urobilinogen, UA: NEGATIVE
pH, UA: 5

## 2014-07-19 MED ORDER — NYSTATIN 100000 UNIT/GM EX POWD: Freq: Three times a day (TID) | CUTANEOUS | Status: DC

## 2014-07-19 NOTE — Progress Notes (Signed)
Patient ID: Susan DickerKaren M Powers, female   DOB: 11/13/1970, 43 y.o.   MRN: [id number] 43 y.o. [id number] MarriedCaucasianF here for annual exam.   Likes the Mirena IUD.  IUD placed 02/18/10. Rarely has menses.  Having external yeast infections.  Problems for a long time.  Occurs 3 - 4 times a year.   Uses Diflucan to treat infection.   No LMP recorded. Patient is not currently having periods (Reason: IUD).          Sexually active: Yes.  female partner  The current method of family planning is IUD.Mirena inserted 02/2010.  Exercising: No.  none. Smoker:  no  Health Maintenance: Pap:  10-18-12 wnl:neg HR HPV History of abnormal Pap:  no MMG:  11-09-13 extremely dense/wnl:[facility] Colonoscopy:  n/a BMD:   n/a TDaP:  2010 Screening Labs: PCP, Hb today: PCP, Urine today: Neg   reports that she has never smoked. She does not have any smokeless tobacco history on file. She reports that she drinks about .5 - 1 ounces of alcohol per week. She reports that she does not use illicit drugs.  History reviewed. No pertinent past medical history.  Past Surgical History  Procedure Laterality Date  . Dilation and curettage of uterus  2003    MAB    Current Outpatient Prescriptions  Medication Sig Dispense Refill  . fluconazole (DIFLUCAN) 150 MG tablet TAKE ONE TABLET BY MOUTH DAILY FOR 5 DAYS  5 tablet  0   No current facility-administered medications for this visit.    Family History  Problem Relation Age of Onset  . Arthritis Mother   . Osteoarthritis Mother   . Arthritis Paternal Grandmother     ROS:  Pertinent items are noted in HPI.  Otherwise, a comprehensive ROS was negative.  Exam:   BP 120/70  Pulse 70  Resp 14  Ht 5' 6.75" (1.695 m)  Wt 151 lb 3.2 oz (68.584 kg)  BMI 23.87 kg/m2     Height: 5' 6.75" (169.5 cm)  Ht Readings from Last 3 Encounters:  07/19/14 5' 6.75" (1.695 m)  12/07/13 5\' 7"  (1.702 m)    General appearance: alert, cooperative and appears stated age Head:  Normocephalic, without obvious abnormality, atraumatic Neck: no adenopathy, supple, symmetrical, trachea midline and thyroid normal to inspection and palpation Lungs: clear to auscultation bilaterally Breasts: normal appearance, no masses or tenderness, Inspection negative, No nipple retraction or dimpling, No nipple discharge or bleeding Heart: regular rate and rhythm Abdomen: soft, non-tender; bowel sounds normal; no masses,  no organomegaly Extremities: extremities normal, atraumatic, no cyanosis or edema Skin: Skin color, texture, turgor normal. No rashes or lesions Lymph nodes: Cervical, supraclavicular, and axillary nodes normal. No abnormal inguinal nodes palpated Neurologic: Grossly normal   Pelvic: External genitalia:  no lesions              Urethra:  normal appearing urethra with no masses, tenderness or lesions              Bartholins and Skenes: normal                 Vagina: normal appearing vagina with normal color and discharge, no lesions              Cervix: anteverted.  IUD strings seen.               Pap taken: No. Bimanual Exam:  Uterus:  normal  Adnexa: normal adnexa               Rectovaginal: Confirms               Anus:  normal sphincter tone, no lesions  A:  Well Woman with normal exam Mirena IUD patient. Recurrent external yeast infections.   P:   Mammogram yearly - 3D recommended. pap smear not indicated this year.  Nystatin powder Rx. IUD exchange in April 2016.  Patient will call in March 2016 to facilitate scheduling.  return annually or prn  An After Visit Summary was printed and given to the patient.

## 2014-07-19 NOTE — Patient Instructions (Signed)

## 2014-08-14 ENCOUNTER — Encounter: Payer: Self-pay | Admitting: Obstetrics and Gynecology

## 2014-11-29 ENCOUNTER — Other Ambulatory Visit: Payer: Self-pay | Admitting: *Deleted

## 2014-11-29 DIAGNOSIS — Z0000 Encounter for general adult medical examination without abnormal findings: Secondary | ICD-10-CM

## 2014-12-05 LAB — COMPLETE METABOLIC PANEL WITH GFR
ALK PHOS: 52 U/L (ref 39–117)
ALT: 9 U/L (ref 0–35)
AST: 11 U/L (ref 0–37)
Albumin: 4.2 g/dL (ref 3.5–5.2)
BUN: 12 mg/dL (ref 6–23)
CO2: 24 mEq/L (ref 19–32)
Calcium: 9 mg/dL (ref 8.4–10.5)
Chloride: 102 mEq/L (ref 96–112)
Creat: 0.71 mg/dL (ref 0.50–1.10)
GFR, Est African American: >89 mL/min
GFR, Est Non African American: >89 mL/min
Glucose, Bld: 88 mg/dL (ref 70–99)
Potassium: 4.1 mEq/L (ref 3.5–5.3)
SODIUM: 138 meq/L (ref 135–145)
Total Bilirubin: 0.6 mg/dL (ref 0.2–1.2)
Total Protein: 6.6 g/dL (ref 6.0–8.3)

## 2014-12-05 LAB — CBC WITH DIFFERENTIAL/PLATELET
BASOS ABS: 0.1 10*3/uL (ref 0.0–0.1)
BASOS PCT: 1 % (ref 0–1)
EOS PCT: 2 % (ref 0–5)
Eosinophils Absolute: 0.1 10*3/uL (ref 0.0–0.7)
HCT: 42.3 % (ref 36.0–46.0)
Hemoglobin: 14.2 g/dL (ref 12.0–15.0)
LYMPHS PCT: 40 % (ref 12–46)
Lymphs Abs: 2 10*3/uL (ref 0.7–4.0)
MCH: 32.5 pg (ref 26.0–34.0)
MCHC: 33.6 g/dL (ref 30.0–36.0)
MCV: 96.8 fL (ref 78.0–100.0)
MONOS PCT: 6 % (ref 3–12)
MPV: 10.4 fL (ref 8.6–12.4)
Monocytes Absolute: 0.3 10*3/uL (ref 0.1–1.0)
NEUTROS ABS: 2.6 10*3/uL (ref 1.7–7.7)
Neutrophils Relative %: 51 % (ref 43–77)
Platelets: 214 10*3/uL (ref 150–400)
RBC: 4.37 MIL/uL (ref 3.87–5.11)
RDW: 12.9 % (ref 11.5–15.5)
WBC: 5.1 10*3/uL (ref 4.0–10.5)

## 2014-12-05 LAB — LIPID PANEL
Cholesterol: 131 mg/dL (ref 0–200)
HDL: 38 mg/dL — ABNORMAL LOW (ref >=46)
LDL CALC: 81 mg/dL (ref 0–99)
TRIGLYCERIDES: 60 mg/dL (ref <150)
Total CHOL/HDL Ratio: 3.4 Ratio
VLDL: 12 mg/dL (ref 0–40)

## 2014-12-05 LAB — VITAMIN D 25 HYDROXY (VIT D DEFICIENCY, FRACTURES): VIT D 25 HYDROXY: 25 ng/mL — AB (ref 30–100)

## 2014-12-05 LAB — TSH: TSH: 1.483 u[IU]/mL (ref 0.350–4.500)

## 2014-12-07 ENCOUNTER — Encounter: Payer: Self-pay | Admitting: Internal Medicine

## 2014-12-07 ENCOUNTER — Ambulatory Visit (INDEPENDENT_AMBULATORY_CARE_PROVIDER_SITE_OTHER): Payer: BLUE CROSS/BLUE SHIELD | Admitting: Internal Medicine

## 2014-12-07 VITALS — BP 113/72 | HR 81 | Resp 16 | Ht 66.75 in | Wt 150.0 lb

## 2014-12-07 DIAGNOSIS — R319 Hematuria, unspecified: Secondary | ICD-10-CM | POA: Diagnosis not present

## 2014-12-07 DIAGNOSIS — Z0000 Encounter for general adult medical examination without abnormal findings: Secondary | ICD-10-CM

## 2014-12-07 DIAGNOSIS — F4323 Adjustment disorder with mixed anxiety and depressed mood: Secondary | ICD-10-CM | POA: Diagnosis not present

## 2014-12-07 DIAGNOSIS — E559 Vitamin D deficiency, unspecified: Secondary | ICD-10-CM

## 2014-12-07 LAB — POCT URINALYSIS DIPSTICK
Bilirubin, UA: NEGATIVE
Glucose, UA: NEGATIVE
KETONES UA: NEGATIVE
Leukocytes, UA: NEGATIVE
Nitrite, UA: NEGATIVE
PH UA: 6.5
Protein, UA: NEGATIVE
RBC UA: NEGATIVE
Spec Grav, UA: 1.01
Urobilinogen, UA: NEGATIVE

## 2014-12-07 NOTE — Progress Notes (Signed)
Subjective:    Patient ID: Susan DickerKaren M Powers, female    DOB: 08/18/1971, 44 y.o.   MRN: [id number]  HPI  07/2014 gyn note A: Well Woman with normal exam Mirena IUD patient. Recurrent external yeast infections.   P: Mammogram yearly - 3D recommended. pap smear not indicated this year.  Nystatin powder Rx. IUD exchange in April 2016. Patient will call in March 2016 to facilitate scheduling.  return annually or prn  An After Visit Summary was printed and given to the patient.  11/2013 note  My note Assessment & Plan:  Healht Maintenance See scannned sheet UTD with mm vaccines  Trapezius pain She does not with muscle relaxants OK for massage and relafen bid for 10 days See me in 4 weeks to reassess  siturational Anxiety/depression Continue wellbutrin  RAsh Will give Diflucan 150 mg daily for 5 days Ok to go to derm Will set up referral         TODay:  Susan Powers is here for CPE  HM:  Recent GYN exam,  Due for pap in 2017,  Need mm  , ,5 pack year smoker  Shoulder pain:  Susan Powers still has bilateral shoulder pain  That comes and goes.  No paresthesias.  Tried massage.   No injury or trauma  Tries not to take NSAIDS  Does not wish Mri as she has high deductible.  No restricted movement    Review of Systems  Respiratory: Negative for cough, chest tightness, shortness of breath and wheezing.   Cardiovascular: Negative for chest pain, palpitations and leg swelling.       Objective:   Physical Exam Physical Exam  Nursing note and vitals reviewed.  Constitutional: She is oriented to person, place, and time. She appears well-developed and well-nourished.  HENT:  Head: Normocephalic and atraumatic.  Right Ear: Tympanic membrane and ear canal normal. No drainage. Tympanic membrane is not injected and not erythematous.  Left Ear: Tympanic membrane and ear canal normal. No drainage. Tympanic membrane is not injected and not erythematous.  Nose: Nose normal.  Right sinus exhibits no maxillary sinus tenderness and no frontal sinus tenderness. Left sinus exhibits no maxillary sinus tenderness and no frontal sinus tenderness.  Mouth/Throat: Oropharynx is clear and moist. No oral lesions. No oropharyngeal exudate.  Eyes: Conjunctivae and EOM are normal. Pupils are equal, round, and reactive to light.  Neck: Normal range of motion. Neck supple. No JVD present. Carotid bruit is not present. No mass and no thyromegaly present.  Cardiovascular: Normal rate, regular rhythm, S1 normal, S2 normal and intact distal pulses. Exam reveals no gallop and no friction rub.  No murmur heard.  Pulses:  Carotid pulses are 2+ on the right side, and 2+ on the left side.  Dorsalis pedis pulses are 2+ on the right side, and 2+ on the left side.  No carotid bruit. No LE edema  Pulmonary/Chest: Breath sounds normal. She has no wheezes. She has no rales. She exhibits no tenderness.  Breast no discrete mass no nipple discharge no axillary adenopathy bilaterally Abdominal: Soft. Bowel sounds are normal. She exhibits no distension and no mass. There is no hepatosplenomegaly. There is no tenderness. There is no CVA tenderness.  Musculoskeletal: Normal range of motion.  Good I/E rotation to shoulders No active synovitis to joints.  Lymphadenopathy:  She has no cervical adenopathy.  She has no axillary adenopathy.  Right: No inguinal and no supraclavicular adenopathy present.  Left: No inguinal and no supraclavicular adenopathy present.  Neurological: She is alert and oriented to person, place, and time. She has normal strength and normal reflexes. She displays no tremor. No cranial nerve deficit or sensory deficit. Coordination and gait normal.  Skin: Skin is warm and dry. No rash noted. No cyanosis. Nails show no clubbing.  Psychiatric: She has a normal mood and affect. Her speech is normal and behavior is normal. Cognition and memory are normal.           Assessment & Plan:   HM: pt to schedule mm,  See scanned sheet  Bilateral shoulder pain  Will refer to sport meds  May benefit from steroid injection.  She declines imaging as she has a high deductible  Adj d/O  Well controlled

## 2014-12-08 ENCOUNTER — Encounter: Payer: Self-pay | Admitting: *Deleted

## 2014-12-08 LAB — URINALYSIS, MICROSCOPIC ONLY
Bacteria, UA: NONE SEEN
Casts: NONE SEEN
Crystals: NONE SEEN
Squamous Epithelial / LPF: NONE SEEN

## 2014-12-11 ENCOUNTER — Encounter: Payer: Self-pay | Admitting: Family Medicine

## 2014-12-11 ENCOUNTER — Ambulatory Visit (INDEPENDENT_AMBULATORY_CARE_PROVIDER_SITE_OTHER): Payer: BLUE CROSS/BLUE SHIELD | Admitting: Family Medicine

## 2014-12-11 VITALS — BP 115/77 | HR 87 | Ht 67.0 in | Wt 150.0 lb

## 2014-12-11 DIAGNOSIS — M549 Dorsalgia, unspecified: Secondary | ICD-10-CM

## 2014-12-11 DIAGNOSIS — M546 Pain in thoracic spine: Secondary | ICD-10-CM

## 2014-12-11 DIAGNOSIS — M25511 Pain in right shoulder: Secondary | ICD-10-CM

## 2014-12-11 DIAGNOSIS — M25512 Pain in left shoulder: Secondary | ICD-10-CM

## 2014-12-11 NOTE — Patient Instructions (Signed)
You have trapezius spasms/strain with peripheral nerve irritation. Physical therapy is the most likely thing to cure this problem. Go to therapy 1-2 times a week and do home exercises on days you do not go. Ibuprofen or aleve as needed for pain. Heat tends to help more than ice 15 minutes at a time 3-4 times a day. Pain medication and muscle relaxants do not make you better quicker for this condition. Follow up with me in 5-6 weeks. If not improving I would consider imaging (MRI) of the cervical spine to assess for disc bulge irritating a nerve more on the right side.

## 2014-12-12 DIAGNOSIS — M549 Dorsalgia, unspecified: Secondary | ICD-10-CM | POA: Insufficient documentation

## 2014-12-12 NOTE — Progress Notes (Signed)
PCP: SCHOENHOFF,DEBBIE, MD  Subjective:   HPI: Patient is a 44 y.o. female here for bilateral shoulder pain.  Patient reports for about 18 months she's had persistent R > L shoulder pain. Associated clicking in right shoulder but no pain with this. Most pain is posterior shoulders. Feels tight and stiff. Massage has helped. No known injury. Gets some numbness into right ulnar digits.  No past medical history on file.  No current outpatient prescriptions on file prior to visit.   No current facility-administered medications on file prior to visit.    Past Surgical History  Procedure Laterality Date  . Dilation and curettage of uterus  2003    MAB    No Known Allergies  History   Social History  . Marital Status: Married    Spouse Name: N/A  . Number of Children: N/A  . Years of Education: N/A   Occupational History  . Not on file.   Social History Main Topics  . Smoking status: Never Smoker   . Smokeless tobacco: Never Used  . Alcohol Use: 0.6 - 1.2 oz/week    1-2 Standard drinks or equivalent per week  . Drug Use: No  . Sexual Activity:    Partners: Male    Birth Control/ Protection: IUD     Comment: Mirena inserted 02/2010   Other Topics Concern  . Not on file   Social History Narrative    Family History  Problem Relation Age of Onset  . Arthritis Mother   . Osteoarthritis Mother   . Arthritis Paternal Grandmother     BP 115/77 mmHg  Pulse 87  Ht 5\' 7"  (1.702 m)  Wt 150 lb (68.04 kg)  BMI 23.49 kg/m2  Review of Systems: See HPI above.    Objective:  Physical Exam:  Gen: NAD  Neck: No gross deformity, swelling, bruising. TTP minimally R paraspinal thoracic region, low cervical.  No midline/bony TTP. FROM neck without pain. BUE strength 5/5.   Sensation intact to light touch currently.   2+ equal reflexes in triceps, biceps, brachioradialis tendons. Negative spurlings. NV intact distal BUEs.  Bilateral shoulders: No swelling,  ecchymoses.  No gross deformity. No TTP. FROM. Negative Hawkins, Neers. Negative Speeds, Yergasons. Strength 5/5 with empty can and resisted internal/external rotation. Negative apprehension. NV intact distally.    Assessment & Plan:  1. Upper back pain - consistent with trapezius spasms/strain with peripheral nerve irritation though disc bulge with radiculopathy also a possibility.  Both are treated similarly.  Physical therapy with home exercises.  NSAIDs as needed, heat for spasms.  F/u in 5-6 weeks.  Consider cervical spine MRI if not improving.

## 2014-12-12 NOTE — Assessment & Plan Note (Signed)
consistent with trapezius spasms/strain with peripheral nerve irritation though disc bulge with radiculopathy also a possibility.  Both are treated similarly.  Physical therapy with home exercises.  NSAIDs as needed, heat for spasms.  F/u in 5-6 weeks.  Consider cervical spine MRI if not improving.

## 2014-12-13 ENCOUNTER — Other Ambulatory Visit: Payer: Self-pay | Admitting: *Deleted

## 2014-12-13 ENCOUNTER — Other Ambulatory Visit: Payer: Self-pay

## 2014-12-13 DIAGNOSIS — Z30430 Encounter for insertion of intrauterine contraceptive device: Secondary | ICD-10-CM

## 2014-12-13 DIAGNOSIS — Z30432 Encounter for removal of intrauterine contraceptive device: Secondary | ICD-10-CM

## 2014-12-13 DIAGNOSIS — Z1231 Encounter for screening mammogram for malignant neoplasm of breast: Secondary | ICD-10-CM

## 2014-12-20 ENCOUNTER — Ambulatory Visit: Payer: BLUE CROSS/BLUE SHIELD | Attending: Family Medicine | Admitting: Physical Therapy

## 2014-12-20 ENCOUNTER — Encounter: Payer: Self-pay | Admitting: Physical Therapy

## 2014-12-20 ENCOUNTER — Ambulatory Visit
Admission: RE | Admit: 2014-12-20 | Discharge: 2014-12-20 | Disposition: A | Discharge status: Home / Self Care | Payer: BLUE CROSS/BLUE SHIELD | Source: Ambulatory Visit

## 2014-12-20 DIAGNOSIS — M792 Neuralgia and neuritis, unspecified: Secondary | ICD-10-CM | POA: Insufficient documentation

## 2014-12-20 DIAGNOSIS — M436 Torticollis: Secondary | ICD-10-CM | POA: Insufficient documentation

## 2014-12-20 DIAGNOSIS — Z1231 Encounter for screening mammogram for malignant neoplasm of breast: Secondary | ICD-10-CM

## 2014-12-20 DIAGNOSIS — G8929 Other chronic pain: Secondary | ICD-10-CM | POA: Insufficient documentation

## 2014-12-20 DIAGNOSIS — M898X1 Other specified disorders of bone, shoulder: Secondary | ICD-10-CM | POA: Diagnosis not present

## 2014-12-20 NOTE — Patient Instructions (Signed)
Initial HEP: R UE Nerve Glides 10x5", 3x/day Low Row/Scapular Retraction Black TB 20x, 3x/day Shoulder ER with Scap retraction Red TB 20x, 3x/day Corner pec stretch with chin tuck 3x20", 3x/day

## 2014-12-20 NOTE — Therapy (Signed)
[facility] [address]  [place], [place], [address] Phone: [phone]   Fax:  [phone]  Physical Therapy Evaluation  Patient Details  Name: Susan Powers MRN: [id number] Date of Birth: 1970/11/13 Referring Provider:  Dene Gentry, MD  Encounter Date: 12/20/2014      PT End of Session - 12/20/14 1032    Visit Number 1   Number of Visits 6   Date for PT Re-Evaluation 01/31/15   PT Start Time 0848   PT Stop Time 1000   PT Time Calculation (min) 72 min      History reviewed. No pertinent past medical history.  Past Surgical History  Procedure Laterality Date  . Dilation and curettage of uterus  2003    MAB    There were no vitals taken for this visit.  Visit Diagnosis:  Radicular pain of right upper extremity - Plan: PT plan of care cert/re-cert  Chronic scapular pain - Plan: PT plan of care cert/re-cert  Neck stiffness - Plan: PT plan of care cert/re-cert      Subjective Assessment - 12/20/14 0853    Symptoms pt with 2 year history of B upper scapular pain and recent onset of tingling into R UE with some R UE reaching activities (R>L).  States pain is often present upon waking in AM.   Patient Stated Goals relieve   Currently in Pain? 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  No   Is the patient reluctant to leave their home because of a fear of falling?  No   Observation/Other Assessments   Focus on Therapeutic Outcomes (FOTO)  25% limitation   Posture/Postural Control   Posture Comments mild forward head and rounded shoulders   ROM / Strength   AROM / PROM / Strength AROM;Strength;PROM   AROM   Overall AROM Comments B UE WNL without c/o pain   AROM Assessment Site Cervical   Cervical Extension 55  central lower neck pain   Cervical - Right Side Bend 25  left pulling   Cervical - Left Side Bend 30  R pulling pain   Cervical - Right Rotation 80   Cervical - Left Rotation 70   PROM   Overall PROM Comments B Shoulders with mild hypermobility into ER (and IR on L but not on R)   Strength   Overall Strength Comments B UE 5/5 other than B Shoulder ER 4/5, and R scapular muscles 4/5 with strong tendency to elevate scapula with all scapular MMT   Palpation   Palpation TTP B UT (R>>L), TTP R 1st rib, high tone and mild TTP noted R scalenes.  Possible slight inflammation noted R anterior neck.   Special Tests    Special Tests Thoracic Outlet Syndrome;Cervical;Rotator Cuff Impingement   Cervical  Tests other;other2   other    Findings Positive   Side Left   Comment mild POS with B quadrant testing   other    Findings Positive   Side Right   Comment ULTT                          PT Education - 12/20/14 1011    Education provided Yes   Education Details HEP   Person(s) Educated Patient   Methods Explanation;Demonstration;Handout   Comprehension Returned demonstration;Verbalized understanding          PT Short Term Goals - 12/20/14 1011    PT SHORT TERM GOAL #1   Title pt independent with initial HEP by 12/27/14   Status New           PT Long Term Goals - 12/20/14 1011    PT LONG TERM GOAL #1   Title pt independent with advanced HEP vs gym resistance training by 01/31/15   Status New   PT LONG TERM GOAL #2   Title pt denies N/T into R UE with normal ADLs, work duties, and chores by 01/31/15   Status New   PT LONG TERM GOAL #3   Title pt  reports pain no greater than 2/10 at worst by 01/31/15   Status New   PT LONG TERM GOAL #4   Title pt displays normal posture and body mechanics with reaching and OH activities without c/o pain by 01/31/15   Status New               Plan - 12/20/14 1016    Clinical Impression Statement pt with c/o B upper scapular pain on/off over past 2 years along with recent onset of intermittent N/T into R UE (posterior upper arm, ulnar aspect of forearm, and digits #4-5).  Symptoms seem related to posture dysfunction causing upper trap TPR, scalene tightness, and possible R 1st rib movement restriction.  R UE N/T seem more TOS in nature (scalenes) rather than true c-spine issue.   Pt will benefit from skilled therapeutic intervention in order to improve on the following deficits Pain;Decreased strength;Impaired UE functional use;Improper body mechanics   Rehab Potential Good   PT Frequency 1x / week  biweekly to 1x/wk due to pt's financial concerns   PT Duration 6 weeks   PT Treatment/Interventions Therapeutic exercise;Manual techniques;Dry needling;Ultrasound;Traction;Electrical Stimulation;Cryotherapy;Moist Heat   PT Next Visit Plan re-assess neck ROM, ULTT, and subjective feedback.  Progress HEP if able.   Consulted and Agree with Plan of Care Patient         Problem List Patient Active Problem List   Diagnosis Date Noted  . Upper back pain 12/12/2014  . Vitamin D deficiency 12/07/2013  . Adjustment disorder with mixed anxiety and depressed mood 04/12/2013  . Seasonal affective disorder 10/18/2012  . Abdominal hernia 10/18/2012  . H/O vitamin D deficiency 07/15/2012  . Muscle spasm 07/15/2012  . Tinea 07/15/2012    Rannie Craney PT, OCS 12/20/2014, 10:34 AM  [facility] [place] [address]  [address] [place], [place], [id number] Phone: [phone]   Fax:  [phone]

## 2014-12-20 NOTE — Therapy (Addendum)
[facility] [address]  [place], [place], [address] Phone: [phone]   Fax:  [phone]  Physical Therapy Evaluation  Patient Details  Name: Susan Powers MRN: [id number] Date of Birth: 1970/11/13 Referring Provider:  Dene Gentry, MD  Encounter Date: 12/20/2014      PT End of Session - 12/20/14 1032    Visit Number 1   Number of Visits 6   Date for PT Re-Evaluation 01/31/15   PT Start Time 0848   PT Stop Time 1000   PT Time Calculation (min) 72 min      History reviewed. No pertinent past medical history.  Past Surgical History  Procedure Laterality Date  . Dilation and curettage of uterus  2003    MAB    There were no vitals taken for this visit.  Visit Diagnosis:  Radicular pain of right upper extremity - Plan: PT plan of care cert/re-cert  Chronic scapular pain - Plan: PT plan of care cert/re-cert  Neck stiffness - Plan: PT plan of care cert/re-cert      Subjective Assessment - 12/20/14 0853    Symptoms pt with 2 year history of B upper scapular pain and recent onset of tingling into R UE with some R UE reaching activities (R>L).  States pain is often present upon waking in AM.   Patient Stated Goals relieve   Currently in Pain? 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  No   Is the patient reluctant to leave their home because of a fear of falling?  No   Observation/Other Assessments   Focus on Therapeutic Outcomes (FOTO)  25% limitation   Posture/Postural Control   Posture Comments mild forward head and rounded shoulders   ROM / Strength   AROM / PROM / Strength AROM;Strength;PROM   AROM   Overall AROM Comments B UE WNL without c/o pain   AROM Assessment Site Cervical   Cervical Extension 55  central lower neck pain   Cervical - Right Side Bend 25  left pulling   Cervical - Left Side Bend 30  R pulling pain   Cervical - Right Rotation 80   Cervical - Left Rotation 70   PROM   Overall PROM Comments B Shoulders with mild hypermobility into ER (and IR on L but not on R)   Strength   Overall Strength Comments B UE 5/5 other than B Shoulder ER 4/5, and R scapular muscles 4/5 with strong tendency to elevate scapula with all scapular MMT   Palpation   Palpation TTP B UT (R>>L), TTP R 1st rib, high tone and mild TTP noted R scalenes.  Possible slight inflammation noted R anterior neck.   Special Tests    Special Tests Thoracic Outlet Syndrome;Cervical;Rotator Cuff Impingement   Cervical  Tests other;other2   other    Findings Positive   Side Left   Comment mild POS with B quadrant testing   other    Findings Positive   Side Right   Comment ULTT                    Trigger Point Dry Needling - 12/20/14 1148    Consent Given? Yes   Muscles Treated Upper Body Upper trapezius  Right   Upper Trapezius Response Twitch reponse elicited  reports feeling more flexible/relaxed following treatment              PT Education - 12/20/14 1011    Education provided Yes   Education Details HEP   Person(s) Educated Patient   Methods Explanation;Demonstration;Handout   Comprehension Returned demonstration;Verbalized understanding          PT Short Term Goals - 12/20/14 1011    PT SHORT TERM GOAL #1   Title pt independent with initial HEP by 12/27/14   Status New           PT Long Term Goals - 12/20/14 1011    PT LONG TERM GOAL #1   Title pt  independent with advanced HEP vs gym resistance training by 01/31/15   Status New   PT LONG TERM GOAL #2   Title pt denies N/T into R UE with normal ADLs, work duties, and chores by 01/31/15   Status New   PT LONG TERM GOAL #3   Title pt reports pain no greater than 2/10 at worst by 01/31/15   Status New   PT LONG TERM GOAL #4   Title pt displays normal posture and body mechanics with reaching and OH activities without c/o pain by 01/31/15   Status New               Plan - 12/20/14 1016    Clinical Impression Statement pt with c/o B upper scapular pain on/off over past 2 years along with recent onset of intermittent N/T into R UE (posterior upper arm, ulnar aspect of forearm, and digits #4-5).  Symptoms seem related to posture dysfunction causing upper trap TPR, scalene tightness, and possible R 1st rib movement restriction.  R UE N/T seem more TOS in nature (scalenes) rather than true c-spine issue.   Pt will benefit from skilled therapeutic intervention in order to improve on the following deficits Pain;Decreased strength;Impaired UE functional use;Improper body mechanics   Rehab Potential Good   PT Frequency 1x / week  biweekly to 1x/wk due to pt's financial concerns   PT Duration 6 weeks   PT Treatment/Interventions Therapeutic exercise;Manual techniques;Dry needling;Ultrasound;Traction;Electrical Stimulation;Cryotherapy;Moist Heat   PT Next Visit Plan re-assess neck ROM, ULTT, and subjective feedback.  Progress HEP if able.   Consulted and Agree with Plan of Care Patient         Problem List Patient Active Problem List   Diagnosis Date Noted  . Upper back pain 12/12/2014  . Vitamin D deficiency 12/07/2013  . Adjustment disorder with mixed anxiety and depressed mood 04/12/2013  . Seasonal affective disorder 10/18/2012  . Abdominal hernia 10/18/2012  . H/O vitamin D deficiency 07/15/2012  . Muscle spasm 07/15/2012  . Tinea 07/15/2012    Kmari Brian PT, OCS 12/20/2014,  11:49 AM  [facility] [address]  [place], [place], [address] Phone: [phone]   Fax:  [phone]    PHYSICAL THERAPY DISCHARGE SUMMARY  Visits from Start of Care: 1  Current functional level related to goals / functional outcomes: unknown   Remaining deficits: unknown   Education / Equipment: initial HEP Plan: Patient agrees to discharge.  Patient goals were not met. Patient is being discharged due to financial reasons.  ?????       Pt did not return after initial evaluation due to high deductible.  Leonette Most PT, OCS 02/13/2015 2:24 PM

## 2015-01-04 ENCOUNTER — Other Ambulatory Visit: Payer: Self-pay | Admitting: Internal Medicine

## 2015-01-04 NOTE — Telephone Encounter (Signed)
Refill request

## 2015-01-07 NOTE — Telephone Encounter (Signed)
Susan Powers  Did pt call for this?  Is she travelling ?

## 2015-01-08 NOTE — Telephone Encounter (Signed)
Yes patient called for this. She is leaving this weekend for a cruise

## 2015-01-17 ENCOUNTER — Ambulatory Visit: Payer: BLUE CROSS/BLUE SHIELD | Admitting: Family Medicine

## 2015-02-12 ENCOUNTER — Ambulatory Visit (INDEPENDENT_AMBULATORY_CARE_PROVIDER_SITE_OTHER): Payer: BLUE CROSS/BLUE SHIELD | Admitting: Obstetrics and Gynecology

## 2015-02-12 ENCOUNTER — Encounter: Payer: Self-pay | Admitting: Obstetrics and Gynecology

## 2015-02-12 DIAGNOSIS — Z30432 Encounter for removal of intrauterine contraceptive device: Secondary | ICD-10-CM | POA: Diagnosis not present

## 2015-02-12 DIAGNOSIS — Z30430 Encounter for insertion of intrauterine contraceptive device: Secondary | ICD-10-CM | POA: Diagnosis not present

## 2015-02-12 DIAGNOSIS — Z30433 Encounter for removal and reinsertion of intrauterine contraceptive device: Secondary | ICD-10-CM

## 2015-02-12 NOTE — Progress Notes (Signed)
Patient ID: Susan Powers, female   DOB: 1971/10/04, 44 y.o.   MRN: [id number]  GYNECOLOGY  VISIT   HPI: 44 y.o.   Married  Caucasian  female   [phone] with No LMP recorded. Patient is not currently having periods (Reason: IUD).   here for  IUD exchange.   Mirena IUD placed 02/18/10. No menses in 4.5 years.  Very occasional spotting now.   Going back to work part time.   GYNECOLOGIC HISTORY: No LMP recorded. Patient is not currently having periods (Reason: IUD). Contraception:    Menopausal hormone therapy:         OB History    Gravida Para Term Preterm AB TAB SAB Ectopic Multiple Living   Patient Active Problem List   Diagnosis Date Noted  . Upper back pain 12/12/2014  . Vitamin D deficiency 12/07/2013  . Adjustment disorder with mixed anxiety and depressed mood 04/12/2013  . Seasonal affective disorder 10/18/2012  . Abdominal hernia 10/18/2012  . H/O vitamin D deficiency 07/15/2012  . Muscle spasm 07/15/2012  . Tinea 07/15/2012    History reviewed. No pertinent past medical history.  Past Surgical History  Procedure Laterality Date  . Dilation and curettage of uterus  2003    MAB  . Mirena      Current Outpatient Prescriptions  Medication Sig Dispense Refill  . acetaminophen (TYLENOL) 325 MG tablet Take 650 mg by mouth every 6 (six) hours as needed.    Marland Kitchen ibuprofen (ADVIL,MOTRIN) 200 MG tablet Take 200 mg by mouth every 6 (six) hours as needed.    Marland Kitchen levonorgestrel (MIRENA) 20 MCG/24HR IUD 1 each by Intrauterine route once.     No current facility-administered medications for this visit.     ALLERGIES: Review of patient's allergies indicates no known allergies.  Family History  Problem Relation Age of Onset  . Arthritis Mother   . Osteoarthritis Mother   . Arthritis Paternal Grandmother     History   Social History  . Marital Status: Married    Spouse Name: N/A  . Number of Children: N/A  . Years of Education: N/A    Occupational History  . Not on file.   Social History Main Topics  . Smoking status: Never Smoker   . Smokeless tobacco: Never Used  . Alcohol Use: 0.6 - 1.2 oz/week    1-2 Standard drinks or equivalent per week  . Drug Use: No  . Sexual Activity:    Partners: Male    Birth Control/ Protection: IUD     Comment: Mirena inserted 02/2010   Other Topics Concern  . Not on file   Social History Narrative    ROS:  Pertinent items are noted in HPI.  PHYSICAL EXAMINATION:    BP 102/64 mmHg  Pulse 80  Resp 20  Ht 5' 6.75" (1.695 m)  Wt 147 lb (66.679 kg)  BMI 23.21 kg/m2     General appearance: alert, cooperative and appears stated age  Pelvic: External genitalia:  no lesions              Urethra:  normal appearing urethra with no masses, tenderness or lesions              Bartholins and Skenes: normal                 Vagina: normal appearing vagina with normal color and discharge,  no lesions              Cervix: normal appearance.  IUD string not seen.                   Bimanual Exam:  Uterus:  uterus is normal size, shape, consistency and nontender                                      Adnexa: normal adnexa in size, nontender and no masses                                       IUD exchange procedure. Consent for IUD removal and insertion of new Mirena IUD.  New Mirena IUD  - Lot number [id number], Expiration 4/18.  Speculum placed in vagina.  Sterile prep with Hibiclens.  IUD removed with ring forceps, intact, and discarded.  Tenaculum to anterior cervical lip.  Uterus sounded to almost 8 cm.  Mirena IUD placed without difficulty.  Strings trimmed.  Repeat pelvic exam unchanged.  No complications.  Minimal EBL.   ASSESSMENT  Mirena IUD removal and reinsertion of new Mirena IUD.   PLAN  Instructions and precautions given  Follow up in one month for IUD check.    An After Visit Summary was printed and given to the patient.

## 2015-03-16 ENCOUNTER — Ambulatory Visit (INDEPENDENT_AMBULATORY_CARE_PROVIDER_SITE_OTHER): Payer: BLUE CROSS/BLUE SHIELD | Admitting: Obstetrics and Gynecology

## 2015-03-16 ENCOUNTER — Encounter: Payer: Self-pay | Admitting: Obstetrics and Gynecology

## 2015-03-16 VITALS — BP 98/70 | HR 84 | Resp 14 | Wt 143.0 lb

## 2015-03-16 DIAGNOSIS — Z30431 Encounter for routine checking of intrauterine contraceptive device: Secondary | ICD-10-CM | POA: Diagnosis not present

## 2015-03-16 NOTE — Progress Notes (Signed)
Patient ID: Susan Powers, female   DOB: [birth year], 44 y.o.   MRN: [id number]  GYNECOLOGY  VISIT   HPI: 44 y.o.   Married  Caucasian  female   [phone] with No LMP recorded. Patient is not currently having periods (Reason: IUD).   here for   A 1 month follow up on Mirena  IUD. Patient is doing well.  Had spotting for a day.  No cramping.  Has not checked strings. No dysapreunia. Happy with the IUD.   GYNECOLOGIC HISTORY: No LMP recorded. Patient is not currently having periods (Reason: IUD). Contraception:IUD Menopausal hormone therapy: N/A Last mammogram: 12-20-14 3D WNL heterogeneously dense [facility] Last pap smear: 10-18-12 WNL NEG HR HPV        OB History    Gravida Para Term Preterm AB TAB SAB Ectopic Multiple Living   Patient Active Problem List   Diagnosis Date Noted  . Upper back pain 12/12/2014  . Vitamin D deficiency 12/07/2013  . Adjustment disorder with mixed anxiety and depressed mood 04/12/2013  . Seasonal affective disorder 10/18/2012  . Abdominal hernia 10/18/2012  . H/O vitamin D deficiency 07/15/2012  . Muscle spasm 07/15/2012  . Tinea 07/15/2012    Past Medical History  Diagnosis Date  . IUD (intrauterine device) in place     Past Surgical History  Procedure Laterality Date  . Dilation and curettage of uterus  2003    MAB  . Mirena      Current Outpatient Prescriptions  Medication Sig Dispense Refill  . acetaminophen (TYLENOL) 325 MG tablet Take 650 mg by mouth every 6 (six) hours as needed.    Marland Kitchen ibuprofen (ADVIL,MOTRIN) 200 MG tablet Take 200 mg by mouth every 6 (six) hours as needed.    Marland Kitchen levonorgestrel (MIRENA) 20 MCG/24HR IUD 1 each by Intrauterine route once.     No current facility-administered medications for this visit.     ALLERGIES: Review of patient's allergies indicates no known allergies.  Family History  Problem Relation Age of Onset  . Arthritis Mother   . Osteoarthritis Mother   . Arthritis  Paternal Grandmother     History   Social History  . Marital Status: Married    Spouse Name: N/A  . Number of Children: N/A  . Years of Education: N/A   Occupational History  . Not on file.   Social History Main Topics  . Smoking status: Never Smoker   . Smokeless tobacco: Never Used  . Alcohol Use: 0.6 - 1.2 oz/week    1-2 Standard drinks or equivalent per week  . Drug Use: No  . Sexual Activity:    Partners: Male    Birth Control/ Protection: IUD     Comment: Mirena inserted 02/2010   Other Topics Concern  . Not on file   Social History Narrative    ROS:  Pertinent items are noted in HPI.  PHYSICAL EXAMINATION:    BP 98/70 mmHg  Pulse 84  Resp 14  Wt 143 lb (64.864 kg)    General appearance: alert, cooperative and appears stated age  Pelvic: External genitalia:  no lesions              Urethra:  normal appearing urethra with no masses, tenderness or lesions              Bartholins and Skenes: normal  Vagina: normal appearing vagina with normal color and discharge, no lesions              Cervix: no lesions and IUD strings seen.               Bimanual Exam:  Uterus:  normal size, contour, position, consistency, mobility, non-tender              Adnexa: normal adnexa and no mass, fullness, tenderness           Chaperone was present for exam.  ASSESSMENT  Mirena IUD check up.  Doing well.   PLAN  IUD now providing pregnancy prevention.  Discussion of Mirena as a long term solution for pregnancy prevention. Call for any problems or concerns. Return for annual exam.   An After Visit Summary was printed and given to the patient.  __10____ minutes face to face time of which over 50% was spent in counseling.

## 2015-05-14 ENCOUNTER — Other Ambulatory Visit: Payer: Self-pay | Admitting: Obstetrics and Gynecology

## 2015-05-14 ENCOUNTER — Encounter: Payer: Self-pay | Admitting: Obstetrics and Gynecology

## 2015-05-14 ENCOUNTER — Telehealth: Payer: Self-pay | Admitting: Emergency Medicine

## 2015-05-14 MED ORDER — FLUCONAZOLE 150 MG PO TABS: 150.0000 mg | ORAL_TABLET | Freq: Once | ORAL | Status: DC

## 2015-05-14 NOTE — Telephone Encounter (Signed)
OK for Diflucan 150 mg, one po.   Repeat dose if symptoms persist after 48 hours.  Dispense 2.  RF none.   Ok to send to pharmacy of choice where she is traveling if possible.  Office visit if symptoms persist.

## 2015-05-14 NOTE — Telephone Encounter (Signed)
Telephone call for triage created to discuss message with patient and disposition as appropriate.   

## 2015-05-14 NOTE — Telephone Encounter (Signed)
Spoke with patient. She is requesting treatment with oral diflucan for treatment of redness, itching and irritated skin between her legs in inner thighs. This is not improving with use of nystatin powder.  She has purchased over the counter clotrimazole as recommended by her pharmacist and will begin to use today. She declines office visit because she states she is leaving to go out of town today and requests treatment with oral diflucan from Dr. Edward Jolly. She states that this has really helped these symptoms in the past.  Advised would send her request to Dr. Edward Jolly and would return her call.

## 2015-05-14 NOTE — Telephone Encounter (Signed)
Chief Complaint  Patient presents with  . Advice Only    Patient sent mychart message requires clinical triage     ===View-only below this line===   ----- Message -----    From: Verl Dicker    Sent: 05/14/2015  1:35 AM EDT      To: Melony Overly, MD Subject: Non-Urgent Medical Question  Hi Dr. Edward Jolly, My nystatin powder prescription is not currently working for the current flare-up of itching on my inner thighs.  It is a bit unbearable and is not easing up after over a week of applying the powder (which usually works).  Could you please renew my fluconazole prescription at the [facility] instead?  If you can't renew my prescription without a new office visit (I am on my way out of town) can you recommend any other over the counter medicine that would alleviate my symptoms?  Thank you!   fluconazole 150 MG tablet Commonly known as:  DIFLUCAN TAKE ONE TABLET BY MOUTH DAILY FOR 5 DAYS     nystatin powder Commonly known as:  MYCOSTATIN Apply topically 3 (three) times daily. Apply to affected area for up to 7 days

## 2015-05-15 NOTE — Telephone Encounter (Signed)
Dr. Edward Jolly sent prescriptions to patient at [facility] and contacted patient via mychart.   Will close encounter.

## 2015-08-10 ENCOUNTER — Ambulatory Visit (INDEPENDENT_AMBULATORY_CARE_PROVIDER_SITE_OTHER): Payer: BLUE CROSS/BLUE SHIELD | Admitting: Obstetrics and Gynecology

## 2015-08-10 ENCOUNTER — Encounter: Payer: Self-pay | Admitting: Obstetrics and Gynecology

## 2015-08-10 VITALS — BP 120/72 | HR 60 | Resp 14 | Ht 66.5 in | Wt 146.0 lb

## 2015-08-10 DIAGNOSIS — Z0000 Encounter for general adult medical examination without abnormal findings: Secondary | ICD-10-CM | POA: Diagnosis not present

## 2015-08-10 DIAGNOSIS — Z01419 Encounter for gynecological examination (general) (routine) without abnormal findings: Secondary | ICD-10-CM

## 2015-08-10 LAB — POCT URINALYSIS DIPSTICK
Bilirubin, UA: NEGATIVE
Blood, UA: NEGATIVE
Glucose, UA: NEGATIVE
Ketones, UA: NEGATIVE
LEUKOCYTES UA: NEGATIVE
NITRITE UA: NEGATIVE
PROTEIN UA: NEGATIVE
UROBILINOGEN UA: NEGATIVE
pH, UA: 5

## 2015-08-10 MED ORDER — NONFORMULARY OR COMPOUNDED ITEM
Status: DC
Start: 2015-08-10 — End: 2016-10-03

## 2015-08-10 MED ORDER — MICONAZOLE NITRATE 1200 & 2 MG & % VA KIT: 1.0000 | PACK | Freq: Once | VAGINAL | Status: DC

## 2015-08-10 NOTE — Progress Notes (Signed)
Patient ID: Verl DickerKaren M Rowand, female   DOB: 07/06/1971, 44 y.o.   MRN: [id number] 44 y.o. [id number] Married Caucasian female here for annual exam.   LMP June 2016.  Still with yeast infections.  Doing probiotics which help.  Generally this is much better.   Musculoskeletal pain from sitting at a desk.  Doing PT.   Wants a prescription for probiotics and for monistat.  Has a HSA.  Went back to work part time.  [place]  Boys are 11 and 5.  PCP:   None  No LMP recorded. Patient is not currently having periods (Reason: IUD).          Sexually active: Yes.   husband The current method of family planning is IUD--Mirena inserted 02-12-15.    Exercising: Yes.    cardio. Smoker:  no  Health Maintenance: Pap:  10-18-12 Neg:Neg HR HPV History of abnormal Pap:  no MMG:  12-20-14 Density Cat.C/Neg/BiRads1:[facility]. Colonoscopy:  n/a BMD:   n/a  Result  n/a TDaP:  2011 Screening Labs:  Hb today: PCP, Urine today: neg   reports that she has never smoked. She has never used smokeless tobacco. She reports that she drinks about 0.6 - 1.2 oz of alcohol per week. She reports that she does not use illicit drugs.  Past Medical History  Diagnosis Date  . IUD (intrauterine device) in place     Past Surgical History  Procedure Laterality Date  . Dilation and curettage of uterus  2003    MAB  . Mirena      Current Outpatient Prescriptions  Medication Sig Dispense Refill  . levonorgestrel (MIRENA) 20 MCG/24HR IUD 1 each by Intrauterine route once.     No current facility-administered medications for this visit.    Family History  Problem Relation Age of Onset  . Arthritis Mother   . Osteoarthritis Mother   . Arthritis Paternal Grandmother     ROS:  Pertinent items are noted in HPI.  Otherwise, a comprehensive ROS was negative.  Exam:   BP 120/72 mmHg  Pulse 60  Resp 14  Ht 5' 6.5" (1.689 m)  Wt 146 lb (66.225 kg)  BMI 23.21 kg/m2    General appearance:  alert, cooperative and appears stated age Head: Normocephalic, without obvious abnormality, atraumatic Neck: no adenopathy, supple, symmetrical, trachea midline and thyroid normal to inspection and palpation Lungs: clear to auscultation bilaterally Breasts: normal appearance, no masses or tenderness, Inspection negative, No nipple retraction or dimpling, No nipple discharge or bleeding, No axillary or supraclavicular adenopathy Heart: regular rate and rhythm Abdomen: soft, non-tender; bowel sounds normal; no masses,  no organomegaly Extremities: extremities normal, atraumatic, no cyanosis or edema Skin: Skin color, texture, turgor normal. No rashes or lesions Lymph nodes: Cervical, supraclavicular, and axillary nodes normal. No abnormal inguinal nodes palpated Neurologic: Grossly normal  Pelvic: External genitalia:  no lesions              Urethra:  normal appearing urethra with no masses, tenderness or lesions              Bartholins and Skenes: normal                 Vagina: normal appearing vagina with normal color and discharge, no lesions              Cervix: no lesions and IUD strings seen.  Pap taken: No. Bimanual Exam:  Uterus:  normal size, contour, position, consistency, mobility, non-tender              Adnexa: normal adnexa and no mass, fullness, tenderness              Rectovaginal: Yes.  .  Confirms.              Anus:  normal sphincter tone, no lesions  Chaperone was present for exam.  Assessment:   Well woman visit with normal exam. Mirena IUD patient.   Plan: Yearly mammogram recommended after age 51.  Recommended self breast exam.  Pap and HR HPV as above. Discussed Calcium, Vitamin D, regular exercise program including cardiovascular and weight bearing exercise. Labs performed.  No..   See orders. Refills given on medications.  Yes.  .  See orders.  Probiotics and Monistat Rx for HSA purposes. Follow up annually and prn.      After visit  summary provided.

## 2015-08-10 NOTE — Patient Instructions (Signed)

## 2015-12-07 MED FILL — AZITHROMYCIN 250 MG TABLET: 250 | 5 days supply | Qty: 6 | Fill #0

## 2016-01-14 DIAGNOSIS — H524 Presbyopia: Secondary | ICD-10-CM | POA: Diagnosis not present

## 2016-02-01 ENCOUNTER — Other Ambulatory Visit (HOSPITAL_BASED_OUTPATIENT_CLINIC_OR_DEPARTMENT_OTHER): Payer: Self-pay | Admitting: Internal Medicine

## 2016-02-01 DIAGNOSIS — Z1231 Encounter for screening mammogram for malignant neoplasm of breast: Secondary | ICD-10-CM

## 2016-02-05 ENCOUNTER — Ambulatory Visit (HOSPITAL_BASED_OUTPATIENT_CLINIC_OR_DEPARTMENT_OTHER)
Admission: RE | Admit: 2016-02-05 | Discharge: 2016-02-05 | Disposition: A | Discharge status: Home / Self Care | Payer: BLUE CROSS/BLUE SHIELD | Source: Ambulatory Visit | Attending: Internal Medicine | Admitting: Internal Medicine

## 2016-02-05 DIAGNOSIS — Z1231 Encounter for screening mammogram for malignant neoplasm of breast: Secondary | ICD-10-CM | POA: Diagnosis not present

## 2016-08-29 ENCOUNTER — Ambulatory Visit: Payer: BLUE CROSS/BLUE SHIELD | Admitting: Obstetrics and Gynecology

## 2016-09-29 NOTE — Progress Notes (Deleted)
45 y.o. G7P0012 Married Caucasian female here for annual exam.    PCP:     No LMP recorded. Patient is not currently having periods (Reason: IUD).           Sexually active: {yes no:314532}  The current method of family planning is IUD--Mirena IUD inserted 02-12-15.    Exercising: {yes no:314532}  {types:19826} Smoker:  no  Health Maintenance: Pap:  10-18-12 Neg:Neg HR HPV History of abnormal Pap:  no MMG: 02-05-16 Density D/Neg/BiRads1:[facility]  Colonoscopy:  *** BMD:   n/a  Result  n/a TDaP:  2011 Gardasil:   N/a HIV: Hep C: Screening Labs:  Hb today: ***, Urine today: ***   reports that she has never smoked. She has never used smokeless tobacco. She reports that she drinks about 0.6 - 1.2 oz of alcohol per week . She reports that she does not use drugs.  Past Medical History:  Diagnosis Date  . IUD (intrauterine device) in place     Past Surgical History:  Procedure Laterality Date  . DILATION AND CURETTAGE OF UTERUS  2003   MAB  . mirena      Current Outpatient Prescriptions  Medication Sig Dispense Refill  . levonorgestrel (MIRENA) 20 MCG/24HR IUD 1 each by Intrauterine route once.    . miconazole (MONISTAT 1 COMBO PACK) kit Place 1 each vaginally once. 1 each 11  . NONFORMULARY OR COMPOUNDED ITEM Lactinex probiotics.  Take 4 tablets once a day. 4 each 11   No current facility-administered medications for this visit.     Family History  Problem Relation Age of Onset  . Arthritis Mother   . Osteoarthritis Mother   . Arthritis Paternal Grandmother     ROS:  Pertinent items are noted in HPI.  Otherwise, a comprehensive ROS was negative.  Exam:   There were no vitals taken for this visit.    General appearance: alert, cooperative and appears stated age Head: Normocephalic, without obvious abnormality, atraumatic Neck: no adenopathy, supple, symmetrical, trachea midline and thyroid normal to inspection and palpation Lungs: clear to auscultation  bilaterally Breasts: normal appearance, no masses or tenderness, No nipple retraction or dimpling, No nipple discharge or bleeding, No axillary or supraclavicular adenopathy Heart: regular rate and rhythm Abdomen: soft, non-tender; no masses, no organomegaly Extremities: extremities normal, atraumatic, no cyanosis or edema Skin: Skin color, texture, turgor normal. No rashes or lesions Lymph nodes: Cervical, supraclavicular, and axillary nodes normal. No abnormal inguinal nodes palpated Neurologic: Grossly normal  Pelvic: External genitalia:  no lesions              Urethra:  normal appearing urethra with no masses, tenderness or lesions              Bartholins and Skenes: normal                 Vagina: normal appearing vagina with normal color and discharge, no lesions              Cervix: no lesions              Pap taken: {yes no:314532} Bimanual Exam:  Uterus:  normal size, contour, position, consistency, mobility, non-tender              Adnexa: no mass, fullness, tenderness              Rectal exam: {yes no:314532}.  Confirms.  Anus:  normal sphincter tone, no lesions  Chaperone was present for exam.  Assessment:   Well woman visit with normal exam.   Plan: Yearly mammogram recommended after age 69.  Recommended self breast exam.  Pap and HR HPV as above. Discussed Calcium, Vitamin D, regular exercise program including cardiovascular and weight bearing exercise.   Follow up annually and prn.   Additional counseling given.  {yes [id number]. _______ minutes face to face time of which over 50% was spent in counseling.    After visit summary provided.

## 2016-10-01 ENCOUNTER — Ambulatory Visit: Payer: BLUE CROSS/BLUE SHIELD | Admitting: Obstetrics and Gynecology

## 2016-10-02 ENCOUNTER — Ambulatory Visit: Payer: BLUE CROSS/BLUE SHIELD | Admitting: Obstetrics and Gynecology

## 2016-10-02 NOTE — Progress Notes (Signed)
45 y.o. [id number] Married Caucasian female here for annual exam.    Needs a written Rx for Lactinex probiotics:  Take 4 tablets po qid.   Dispense:  5 Packs of 50. RF 11. This control yeast infections.   Some hot flashes and vaginal dryness.  Tolerable.   Kids are doing well.   PCP:   Dr. Constance GoltzSchoenhoff [facility]  No LMP recorded. Patient is not currently having periods (Reason: IUD).           Sexually active: Yes.    The current method of family planning is IUD--Mirena inserted 02-12-15    Exercising: Yes.    cardio.  Spin or body bump.  Smoker:  no  Health Maintenance: Pap:  10-18-12 Neg:Neg HR HPV History of abnormal Pap:  no MMG:  03-06-16 Density D/Neg/BiRads1:[facility] Hi Pt Colonoscopy:  n/a BMD:   n/a  Result  n/a TDaP:  2011 Gardasil:   N/A HIV:  Neg in pregnancy.  Hep C:  NA. Screening Labs:  Hb today: PCP, Urine today: PCP   reports that she has never smoked. She has never used smokeless tobacco. She reports that she drinks about 0.6 - 1.2 oz of alcohol per week . She reports that she does not use drugs.  Past Medical History:  Diagnosis Date  . IUD (intrauterine device) in place     Past Surgical History:  Procedure Laterality Date  . DILATION AND CURETTAGE OF UTERUS  2003   MAB  . mirena      Current Outpatient Prescriptions  Medication Sig Dispense Refill  . levonorgestrel (MIRENA) 20 MCG/24HR IUD 1 each by Intrauterine route once.    . NONFORMULARY OR COMPOUNDED ITEM Lactinex probiotics.  Take 4 tablets once a day. (Patient taking differently: Lactinex probiotics.  Take 4 tablets 4 times a day.) 4 each 11   No current facility-administered medications for this visit.     Family History  Problem Relation Age of Onset  . Arthritis Mother   . Osteoarthritis Mother   . Arthritis Paternal Grandmother     ROS:  Pertinent items are noted in HPI.  Otherwise, a comprehensive ROS was negative.  Exam:   BP 102/62 (BP Location: Right Arm, Patient  Position: Sitting, Cuff Size: Normal)   Pulse 84   Resp 16   Ht 5\' 6"  (1.676 m)   Wt 146 lb (66.2 kg)   BMI 23.57 kg/m     General appearance: alert, cooperative and appears stated age Head: Normocephalic, without obvious abnormality, atraumatic Neck: no adenopathy, supple, symmetrical, trachea midline and thyroid normal to inspection and palpation Lungs: clear to auscultation bilaterally Breasts: normal appearance, no masses or tenderness, No nipple retraction or dimpling, No nipple discharge or bleeding, No axillary or supraclavicular adenopathy Heart: regular rate and rhythm Abdomen: soft, non-tender; no masses, no organomegaly Extremities: extremities normal, atraumatic, no cyanosis or edema Skin: Skin color, texture, turgor normal. No rashes or lesions Lymph nodes: Cervical, supraclavicular, and axillary nodes normal. No abnormal inguinal nodes palpated Neurologic: Grossly normal  Pelvic: External genitalia:  no lesions              Urethra:  normal appearing urethra with no masses, tenderness or lesions              Bartholins and Skenes: normal                 Vagina: normal appearing vagina with normal color and discharge, no lesions  Cervix: no lesions.  IUD strings seen.              Pap taken: Yes.   Bimanual Exam:  Uterus:  normal size, contour, position, consistency, mobility, non-tender              Adnexa: no mass, fullness, tenderness              Rectal exam: Yes.  .  Confirms.              Anus:  normal sphincter tone, no lesions  Chaperone was present for exam.  Assessment:   Well woman visit with normal exam. Mirena IUD patient. Frequent yeast infections controlled with probiotics.  Minor menopausal symptoms.   Plan: Mammogram screening discussed. Recommended self breast awareness. Pap and HR HPV as above. Guidelines for Calcium, Vitamin D, regular exercise program including cardiovascular and weight bearing exercise. Refill of  probiotics. Discussed vaginal lubricants.  No estrogen Rx at this time.  Follow up annually and prn.      After visit summary provided.

## 2016-10-03 ENCOUNTER — Ambulatory Visit (INDEPENDENT_AMBULATORY_CARE_PROVIDER_SITE_OTHER): Payer: BLUE CROSS/BLUE SHIELD | Admitting: Obstetrics and Gynecology

## 2016-10-03 ENCOUNTER — Encounter: Payer: Self-pay | Admitting: Obstetrics and Gynecology

## 2016-10-03 VITALS — BP 102/62 | HR 84 | Resp 16 | Ht 66.0 in | Wt 146.0 lb

## 2016-10-03 DIAGNOSIS — Z01419 Encounter for gynecological examination (general) (routine) without abnormal findings: Secondary | ICD-10-CM | POA: Diagnosis not present

## 2016-10-03 MED ORDER — NONFORMULARY OR COMPOUNDED ITEM: 11 refills | Status: DC

## 2016-10-03 NOTE — Patient Instructions (Signed)

## 2016-10-08 LAB — IPS PAP TEST WITH HPV

## 2016-10-08 MED FILL — LACTINEX CHEWABLE TABLET: 13 days supply | Qty: 200 | Fill #0

## 2016-12-08 DIAGNOSIS — E559 Vitamin D deficiency, unspecified: Secondary | ICD-10-CM | POA: Diagnosis not present

## 2016-12-08 DIAGNOSIS — T753XXA Motion sickness, initial encounter: Secondary | ICD-10-CM | POA: Diagnosis not present

## 2016-12-08 DIAGNOSIS — Z0000 Encounter for general adult medical examination without abnormal findings: Secondary | ICD-10-CM | POA: Diagnosis not present

## 2016-12-09 MED FILL — SCOPOLAMINE 1 MG/3DAYS PTCH: 1 | 12 days supply | Qty: 4 | Fill #0

## 2016-12-12 MED FILL — LACTINEX CHEWABLE TABLET: 13 days supply | Qty: 200 | Fill #1

## 2017-04-17 MED FILL — LACTINEX CHEWABLE TABLET: 13 days supply | Qty: 200 | Fill #2

## 2017-06-24 DIAGNOSIS — H52223 Regular astigmatism, bilateral: Secondary | ICD-10-CM | POA: Diagnosis not present

## 2017-06-24 DIAGNOSIS — H524 Presbyopia: Secondary | ICD-10-CM | POA: Diagnosis not present

## 2017-07-29 ENCOUNTER — Other Ambulatory Visit (HOSPITAL_BASED_OUTPATIENT_CLINIC_OR_DEPARTMENT_OTHER): Payer: Self-pay | Admitting: Internal Medicine

## 2017-07-29 DIAGNOSIS — Z1231 Encounter for screening mammogram for malignant neoplasm of breast: Secondary | ICD-10-CM

## 2017-08-10 ENCOUNTER — Ambulatory Visit (HOSPITAL_BASED_OUTPATIENT_CLINIC_OR_DEPARTMENT_OTHER)
Admission: RE | Admit: 2017-08-10 | Discharge: 2017-08-10 | Disposition: A | Discharge status: Home / Self Care | Payer: BLUE CROSS/BLUE SHIELD | Source: Ambulatory Visit | Attending: Internal Medicine | Admitting: Internal Medicine

## 2017-08-10 DIAGNOSIS — Z1231 Encounter for screening mammogram for malignant neoplasm of breast: Secondary | ICD-10-CM | POA: Diagnosis not present

## 2017-09-07 ENCOUNTER — Ambulatory Visit: Payer: BLUE CROSS/BLUE SHIELD | Admitting: Obstetrics and Gynecology

## 2017-09-07 ENCOUNTER — Other Ambulatory Visit: Payer: Self-pay

## 2017-09-07 ENCOUNTER — Ambulatory Visit (INDEPENDENT_AMBULATORY_CARE_PROVIDER_SITE_OTHER): Payer: BLUE CROSS/BLUE SHIELD | Admitting: Obstetrics and Gynecology

## 2017-09-07 ENCOUNTER — Encounter: Payer: Self-pay | Admitting: Obstetrics and Gynecology

## 2017-09-07 VITALS — BP 112/66 | HR 70 | Resp 14 | Ht 66.0 in | Wt 150.0 lb

## 2017-09-07 DIAGNOSIS — R5381 Other malaise: Secondary | ICD-10-CM | POA: Diagnosis not present

## 2017-09-07 DIAGNOSIS — R7989 Other specified abnormal findings of blood chemistry: Secondary | ICD-10-CM | POA: Diagnosis not present

## 2017-09-07 DIAGNOSIS — Z01419 Encounter for gynecological examination (general) (routine) without abnormal findings: Secondary | ICD-10-CM | POA: Diagnosis not present

## 2017-09-07 DIAGNOSIS — R5383 Other fatigue: Secondary | ICD-10-CM | POA: Diagnosis not present

## 2017-09-07 MED ORDER — NONFORMULARY OR COMPOUNDED ITEM: 11 refills | Status: DC

## 2017-09-07 NOTE — Progress Notes (Signed)
46 y.o. [id number] Married Caucasian female here for annual exam.    Feels like is something is off.  Not motivated.  Not sure if she is low in vit D.  Some stress at home with remodeling and childcare issues.  Denies depression.  Son is now 14 years ago.   No menses. Has Mirena IUD.  PCP: Raechel Chuteeborah Schoenhoff, MD  No LMP recorded. Patient is not currently having periods (Reason: IUD).     Period Cycle (Days): (no cycles with Mirena IUD)     Sexually active: Yes.   husband The current method of family planning is IUD--Mirena IUD inserted 02-12-15.    Exercising: Yes.    Cardio and other exercises Smoker:  no  Health Maintenance: Pap: 10-03-16 Neg:Neg HR HPV,  10-18-12 Neg:Neg HR HPV History of abnormal Pap:  no MMG: 08-10-17 Density C/Neg/Birads1:TBC Colonoscopy:  n/a BMD:   n/a  Result  n/a TDaP:  2011 Gardasil:   no HIV:  Neg in pregnancy.  Hep C:  NA Screening Labs:  Hb today: PCP, Urine today: not done   reports that  has never smoked. she has never used smokeless tobacco. She reports that she drinks about 0.6 - 1.2 oz of alcohol per week. She reports that she does not use drugs.  Past Medical History:  Diagnosis Date  . IUD (intrauterine device) in place     Past Surgical History:  Procedure Laterality Date  . DILATION AND CURETTAGE OF UTERUS  2003   MAB  . mirena      Current Outpatient Medications  Medication Sig Dispense Refill  . levonorgestrel (MIRENA) 20 MCG/24HR IUD 1 each by Intrauterine route once.    . NONFORMULARY OR COMPOUNDED ITEM Lactinex probiotics.  Take 4 tablets 4 times a day. 4 each 11   No current facility-administered medications for this visit.     Family History  Problem Relation Age of Onset  . Arthritis Mother   . Osteoarthritis Mother   . Arthritis Paternal Grandmother     ROS:  Pertinent items are noted in HPI.  Otherwise, a comprehensive ROS was negative.  Exam:   BP 112/66 (BP Location: Left Arm, Patient Position: Sitting,  Cuff Size: Normal)   Pulse 70   Resp 14   Ht 5\' 6"  (1.676 m)   Wt 150 lb (68 kg)   BMI 24.21 kg/m     General appearance: alert, cooperative and appears stated age.  Tearful.  Head: Normocephalic, without obvious abnormality, atraumatic Neck: no adenopathy, supple, symmetrical, trachea midline and thyroid normal to inspection and palpation Lungs: clear to auscultation bilaterally Breasts: normal appearance, no masses or tenderness, No nipple retraction or dimpling, No nipple discharge or bleeding, No axillary or supraclavicular adenopathy Heart: regular rate and rhythm Abdomen: soft, non-tender; no masses, no organomegaly Extremities: extremities normal, atraumatic, no cyanosis or edema Skin: Skin color, texture, turgor normal. No rashes or lesions Lymph nodes: Cervical, supraclavicular, and axillary nodes normal. No abnormal inguinal nodes palpated Neurologic: Grossly normal  Pelvic: External genitalia:  no lesions              Urethra:  normal appearing urethra with no masses, tenderness or lesions              Bartholins and Skenes: normal                 Vagina: normal appearing vagina with normal color and discharge, no lesions  Cervix: no lesions.  IUD strings noted.               Pap taken: No. Bimanual Exam:  Uterus:  normal size, contour, position, consistency, mobility, non-tender              Adnexa: no mass, fullness, tenderness              Rectal exam: Yes.  .  Confirms.              Anus:  normal sphincter tone, no lesions  Chaperone was present for exam.  Assessment:   Well woman visit with normal exam. Mirena IUD patient. Malaise.  Hx frequent yeast infections.   Plan: Mammogram screening discussed. Recommended self breast awareness. Pap and HR HPV as above. Guidelines for Calcium, Vitamin D, regular exercise program including cardiovascular and weight bearing exercise. CMP, CBC, TSH, vit D, and vit B12.  Referral to Berniece AndreasJulie Whitt, therapist.   Card given.  Benefits of counseling discussed.  Refill of probiotics.  I discussed colonoscopy.  She will check with her insurance regarding coverage. Follow up annually and prn.   After visit summary provided.

## 2017-09-07 NOTE — Patient Instructions (Signed)

## 2017-09-08 LAB — COMPREHENSIVE METABOLIC PANEL
A/G RATIO: 1.7 (ref 1.2–2.2)
ALBUMIN: 4.3 g/dL (ref 3.5–5.5)
ALT: 11 IU/L (ref 0–32)
AST: 17 IU/L (ref 0–40)
Alkaline Phosphatase: 57 IU/L (ref 39–117)
BILIRUBIN TOTAL: 0.4 mg/dL (ref 0.0–1.2)
BUN / CREAT RATIO: 16 (ref 9–23)
BUN: 12 mg/dL (ref 6–24)
CALCIUM: 8.7 mg/dL (ref 8.7–10.2)
CO2: 24 mmol/L (ref 20–29)
Chloride: 104 mmol/L (ref 96–106)
Creatinine, Ser: 0.76 mg/dL (ref 0.57–1.00)
GFR, EST AFRICAN AMERICAN: 110 mL/min/{1.73_m2} (ref >59)
GFR, EST NON AFRICAN AMERICAN: 95 mL/min/{1.73_m2} (ref >59)
GLOBULIN, TOTAL: 2.6 g/dL (ref 1.5–4.5)
Glucose: 80 mg/dL (ref 65–99)
Potassium: 4.2 mmol/L (ref 3.5–5.2)
SODIUM: 142 mmol/L (ref 134–144)
TOTAL PROTEIN: 6.9 g/dL (ref 6.0–8.5)

## 2017-09-08 LAB — CBC
HEMATOCRIT: 41.1 % (ref 34.0–46.6)
HEMOGLOBIN: 13.6 g/dL (ref 11.1–15.9)
MCH: 33 pg (ref 26.6–33.0)
MCHC: 33.1 g/dL (ref 31.5–35.7)
MCV: 100 fL — AB (ref 79–97)
PLATELETS: 228 10*3/uL (ref 150–379)
RBC: 4.12 x10E6/uL (ref 3.77–5.28)
RDW: 12.7 % (ref 12.3–15.4)
WBC: 6.7 10*3/uL (ref 3.4–10.8)

## 2017-09-08 LAB — TSH: TSH: 1.22 u[IU]/mL (ref 0.450–4.500)

## 2017-09-08 LAB — VITAMIN D 25 HYDROXY (VIT D DEFICIENCY, FRACTURES): Vit D, 25-Hydroxy: 22.6 ng/mL — ABNORMAL LOW (ref 30.0–100.0)

## 2017-09-08 LAB — VITAMIN B12: Vitamin B-12: 995 pg/mL (ref 232–1245)

## 2017-09-09 ENCOUNTER — Telehealth: Payer: Self-pay

## 2017-09-09 MED ORDER — VITAMIN D (ERGOCALCIFEROL) 1.25 MG (50000 UNIT) PO CAPS: 50000.0000 [IU] | ORAL_CAPSULE | ORAL | 0 refills | Status: DC

## 2017-09-09 MED FILL — VIT D2 1.25 MG (50,000 UNIT: 1.25 MG | 84 days supply | Qty: 12 | Fill #0

## 2017-09-09 NOTE — Telephone Encounter (Signed)
Spoke with patient. Advised of lab results as seen below from Dr.Silva. Patient verbalzies understanding. Rx for Vitamin D 50,000 IU weekly #12 0RF sent to pharmacy on file. Patient will be seeing her PCP in March and would like to have her Vitamin D level checked at that time. Will send a copy of the results to our office once completed. Will have PCP manage labs. Labs from 09/07/2017 faxed to Dr.Schoenhoff. Encounter closed.

## 2017-09-09 NOTE — Telephone Encounter (Signed)
-----   Message from Patton SallesBrook E Amundson C Silva, MD sent at 09/08/2017  4:04 PM EST ----- Please contact patient with results.  Her vitamin D level is low, and I am recommending vitamin D 50,000 IU orally every week for 3 months.  Please send to pharmacy of choice.  Please schedule a lab visit for 3 months.  I will place a future order.  Blood chemistries, thyroid, and vitamin B12 are normal.   One of her parameters on her blood counts indicated she could have folic acid deficiency, and I have asked Baird LyonsCasey in the lab to add this on.

## 2017-09-11 MED FILL — TRANSDERM-SCOP 1.5 MG/3 DAY: 1 | 12 days supply | Qty: 4 | Fill #1

## 2017-09-11 MED FILL — LACTINEX CHEWABLE TABLET: 13 days supply | Qty: 200 | Fill #3

## 2017-09-12 LAB — SPECIMEN STATUS REPORT

## 2017-09-12 LAB — FOLATE

## 2017-12-21 DIAGNOSIS — E559 Vitamin D deficiency, unspecified: Secondary | ICD-10-CM | POA: Diagnosis not present

## 2017-12-21 DIAGNOSIS — Z13220 Encounter for screening for lipoid disorders: Secondary | ICD-10-CM | POA: Diagnosis not present

## 2017-12-21 DIAGNOSIS — Z87898 Personal history of other specified conditions: Secondary | ICD-10-CM | POA: Diagnosis not present

## 2017-12-21 DIAGNOSIS — Z0000 Encounter for general adult medical examination without abnormal findings: Secondary | ICD-10-CM | POA: Diagnosis not present

## 2018-07-09 MED FILL — LACTINEX CHEWABLE TABLET: 16 days supply | Qty: 250 | Fill #0

## 2018-08-05 ENCOUNTER — Other Ambulatory Visit (HOSPITAL_BASED_OUTPATIENT_CLINIC_OR_DEPARTMENT_OTHER): Payer: Self-pay | Admitting: Internal Medicine

## 2018-08-05 DIAGNOSIS — Z1231 Encounter for screening mammogram for malignant neoplasm of breast: Secondary | ICD-10-CM

## 2018-08-12 ENCOUNTER — Ambulatory Visit (HOSPITAL_BASED_OUTPATIENT_CLINIC_OR_DEPARTMENT_OTHER)
Admission: RE | Admit: 2018-08-12 | Discharge: 2018-08-12 | Disposition: A | Discharge status: Home / Self Care | Payer: BLUE CROSS/BLUE SHIELD | Source: Ambulatory Visit | Attending: Internal Medicine | Admitting: Internal Medicine

## 2018-08-12 DIAGNOSIS — Z1231 Encounter for screening mammogram for malignant neoplasm of breast: Secondary | ICD-10-CM | POA: Diagnosis not present

## 2018-08-12 MED FILL — LACTINEX CHEWABLE TABLET: 16 days supply | Qty: 250 | Fill #0

## 2018-09-20 NOTE — Progress Notes (Addendum)
47 y.o. [id number] Married Caucasian female here for annual exam.   Patient concerned she may have an umbilical hernia. Has a faint pain when she touches it.  Feels it when she coughs.   Has low vit D.  This was rechecked with her PCP, and it was normal. Taking a MVI now.   No cycles.   PCP:  Raechel Chuteeborah Schoenhoff, MD  No LMP recorded. (Menstrual status: IUD).     Period Cycle (Days): (Mirena IUD)     Sexually active: Yes.   female The current method of family planning is IUD--Mirena 02-12-15.    Exercising: Yes.    eliptical and spin. Smoker:  no  Health Maintenance: Pap: 10-03-16 Neg:Neg HR HPV, 10-18-12 Neg:Neg HR HPv History of abnormal Pap:  no MMG: 08-12-18 Neg/density D/BiRads1 Colonoscopy:  n/a BMD:   n/a  Result  n/a TDaP:  2011 Gardasil:   no HIV: Neg in pregnancy Hep C:no Screening Labs:  Hb today: PCP Flu vaccine:  Done.    reports that she has never smoked. She has never used smokeless tobacco. She reports that she drinks about 1.0 - 2.0 standard drinks of alcohol per week. She reports that she does not use drugs.  Past Medical History:  Diagnosis Date  . IUD (intrauterine device) in place     Past Surgical History:  Procedure Laterality Date  . DILATION AND CURETTAGE OF UTERUS  2003   MAB  . mirena      Current Outpatient Medications  Medication Sig Dispense Refill  . levonorgestrel (MIRENA) 20 MCG/24HR IUD 1 each by Intrauterine route once.    . NONFORMULARY OR COMPOUNDED ITEM Lactinex probiotics.  Take 4 tablets 4 times a day. 4 each 11   No current facility-administered medications for this visit.     Family History  Problem Relation Age of Onset  . Arthritis Mother   . Osteoarthritis Mother   . Arthritis Paternal Grandmother     Review of Systems  All other systems reviewed and are negative.   Exam:   BP 104/64 (BP Location: Right Arm, Patient Position: Sitting, Cuff Size: Normal)   Pulse 80   Resp 14   Ht 5' 6.5" (1.689 m)   Wt 150 lb  12.8 oz (68.4 kg)   BMI 23.98 kg/m     General appearance: alert, cooperative and appears stated age Head: Normocephalic, without obvious abnormality, atraumatic Neck: no adenopathy, supple, symmetrical, trachea midline and thyroid normal to inspection and palpation Lungs: clear to auscultation bilaterally Breasts: normal appearance, no masses or tenderness, No nipple retraction or dimpling, No nipple discharge or bleeding, No axillary or supraclavicular adenopathy Heart: regular rate and rhythm Abdomen: soft, non-tender; no masses, no organomegaly.  Small umbilical hernia.  Extremities: extremities normal, atraumatic, no cyanosis or edema Skin: Skin color, texture, turgor normal. No rashes or lesions Lymph nodes: Cervical, supraclavicular, and axillary nodes normal. No abnormal inguinal nodes palpated Neurologic: Grossly normal  Pelvic: External genitalia:  no lesions              Urethra:  normal appearing urethra with no masses, tenderness or lesions              Bartholins and Skenes: normal                 Vagina: normal appearing vagina with normal color and discharge, no lesions              Cervix: no lesions  Pap taken: Yes.   Bimanual Exam:  Uterus:  normal size, contour, position, consistency, mobility, non-tender              Adnexa: no mass, fullness, tenderness              Rectal exam: Yes.  .  Confirms.              Anus:  normal sphincter tone, no lesions  Chaperone was present for exam.  Assessment:   Well woman visit with normal exam. Mirena IUD patient. Low vit D.  Hx frequent yeast infections.  Umbilical hernia.   Plan: Mammogram screening. Recommended self breast awareness. Pap and HR HPV as above. Guidelines for Calcium, Vitamin D, regular exercise program including cardiovascular and weight bearing exercise. Rx for probiotics.  IFOB.  Will refer to surgeon of choice.  She will do her research and let me know who she would like to see.   Follow up annually and prn.   After visit summary provided.

## 2018-09-22 ENCOUNTER — Ambulatory Visit (INDEPENDENT_AMBULATORY_CARE_PROVIDER_SITE_OTHER): Payer: BLUE CROSS/BLUE SHIELD | Admitting: Obstetrics and Gynecology

## 2018-09-22 ENCOUNTER — Other Ambulatory Visit (HOSPITAL_COMMUNITY)
Admission: RE | Admit: 2018-09-22 | Discharge: 2018-09-22 | Disposition: A | Discharge status: Home / Self Care | Payer: BLUE CROSS/BLUE SHIELD | Source: Ambulatory Visit | Attending: Obstetrics and Gynecology | Admitting: Obstetrics and Gynecology

## 2018-09-22 ENCOUNTER — Encounter: Payer: Self-pay | Admitting: Obstetrics and Gynecology

## 2018-09-22 ENCOUNTER — Other Ambulatory Visit: Payer: Self-pay

## 2018-09-22 VITALS — BP 104/64 | HR 80 | Resp 14 | Ht 66.5 in | Wt 150.8 lb

## 2018-09-22 DIAGNOSIS — Z1211 Encounter for screening for malignant neoplasm of colon: Secondary | ICD-10-CM

## 2018-09-22 DIAGNOSIS — Z01419 Encounter for gynecological examination (general) (routine) without abnormal findings: Secondary | ICD-10-CM

## 2018-09-22 MED ORDER — NONFORMULARY OR COMPOUNDED ITEM: 11 refills | Status: DC

## 2018-09-22 NOTE — Patient Instructions (Signed)

## 2018-09-24 LAB — CYTOLOGY - PAP
DIAGNOSIS: NEGATIVE
HPV (WINDOPATH): NOT DETECTED

## 2018-12-28 DIAGNOSIS — Z23 Encounter for immunization: Secondary | ICD-10-CM | POA: Diagnosis not present

## 2018-12-28 DIAGNOSIS — Z0000 Encounter for general adult medical examination without abnormal findings: Secondary | ICD-10-CM | POA: Diagnosis not present

## 2018-12-28 DIAGNOSIS — E559 Vitamin D deficiency, unspecified: Secondary | ICD-10-CM | POA: Diagnosis not present

## 2018-12-28 DIAGNOSIS — Z13220 Encounter for screening for lipoid disorders: Secondary | ICD-10-CM | POA: Diagnosis not present

## 2019-04-27 MED FILL — LACTINEX CHEWABLE TABLET: 13 days supply | Qty: 200 | Fill #0

## 2019-04-27 MED FILL — AMOXICILLIN 875 MG TABS: 875 | 10 days supply | Qty: 20 | Fill #0

## 2019-04-27 MED FILL — IBUPROFEN 800 MG TAB: 800 | 5 days supply | Qty: 20 | Fill #0

## 2019-08-05 MED FILL — LACTINEX CHEWABLE TABLET: 13 days supply | Qty: 200 | Fill #1

## 2019-08-10 ENCOUNTER — Other Ambulatory Visit (HOSPITAL_BASED_OUTPATIENT_CLINIC_OR_DEPARTMENT_OTHER): Payer: Self-pay | Admitting: Obstetrics and Gynecology

## 2019-08-10 DIAGNOSIS — Z1231 Encounter for screening mammogram for malignant neoplasm of breast: Secondary | ICD-10-CM

## 2019-08-16 ENCOUNTER — Other Ambulatory Visit: Payer: Self-pay

## 2019-08-17 ENCOUNTER — Encounter (HOSPITAL_BASED_OUTPATIENT_CLINIC_OR_DEPARTMENT_OTHER): Payer: BLUE CROSS/BLUE SHIELD

## 2019-08-18 ENCOUNTER — Encounter: Payer: Self-pay | Admitting: Obstetrics and Gynecology

## 2019-08-18 ENCOUNTER — Ambulatory Visit (INDEPENDENT_AMBULATORY_CARE_PROVIDER_SITE_OTHER): Payer: BC Managed Care – PPO | Admitting: Obstetrics and Gynecology

## 2019-08-18 VITALS — BP 100/58 | HR 80 | Temp 97.7°F | Resp 14 | Ht 66.0 in | Wt 148.4 lb

## 2019-08-18 DIAGNOSIS — R7989 Other specified abnormal findings of blood chemistry: Secondary | ICD-10-CM

## 2019-08-18 DIAGNOSIS — Z01419 Encounter for gynecological examination (general) (routine) without abnormal findings: Secondary | ICD-10-CM

## 2019-08-18 DIAGNOSIS — E559 Vitamin D deficiency, unspecified: Secondary | ICD-10-CM | POA: Diagnosis not present

## 2019-08-18 MED ORDER — NONFORMULARY OR COMPOUNDED ITEM: 11 refills | Status: DC

## 2019-08-18 NOTE — Progress Notes (Signed)
48 y.o. [id number] Married Caucasian female here for annual exam.   Doing well with Mirena.  Spotted last month for one hour only.  Feels sweaty at night which is random.   Patient feels like she has a yeast infection now.   Having some discharge.  No itching or odor.  Has Monistat 3 and has not used yet. She declines evaluation.  Takes probiotics routinely.  Hx of low vitamin D with PCP and would like this rechecked. She is taking MVI and yogurt.   PCP:  Raechel Chute, MD   No LMP recorded. (Menstrual status: IUD).           Sexually active: Yes.    The current method of family planning is IUD--Mirena 02-12-15.    Exercising: Yes.    walking Smoker:  no   Health Maintenance: Pap: 09-22-18 Neg:Neg HR HPV,10-03-16 Neg:Neg HR HPV, 10-18-12 Neg:Neg HR HPV History of abnormal Pap:  no MMG: 08-12-18 3D/Neg/density D/BiRads1--appt.08-24-19 Colonoscopy:  n/a BMD:   n/a  Result  n/a TDaP: 12-28-18 Gardasil:   no HIV:Neg in pregnancy Hep C:no Screening Labs:   PCP.  Flu vaccine:  She will do.    reports that she has never smoked. She has never used smokeless tobacco. She reports current alcohol use of about 1.0 - 2.0 standard drinks of alcohol per week. She reports that she does not use drugs.  Past Medical History:  Diagnosis Date  . IUD (intrauterine device) in place     Past Surgical History:  Procedure Laterality Date  . DILATION AND CURETTAGE OF UTERUS  2003   MAB  . mirena      Current Outpatient Medications  Medication Sig Dispense Refill  . levonorgestrel (MIRENA) 20 MCG/24HR IUD 1 each by Intrauterine route once.    . NONFORMULARY OR COMPOUNDED ITEM Lactinex probiotics.  Take 4 tablets 4 times a day. 4 each 11   No current facility-administered medications for this visit.     Family History  Problem Relation Age of Onset  . Arthritis Mother   . Osteoarthritis Mother   . Arthritis Paternal Grandmother     Review of Systems  All other systems reviewed  and are negative.   Exam:   BP (!) 100/58   Pulse 80   Temp 97.7 F (36.5 C) (Temporal)   Resp 14   Ht 5\' 6"  (1.676 m)   Wt 148 lb 6.4 oz (67.3 kg)   BMI 23.95 kg/m     General appearance: alert, cooperative and appears stated age Head: normocephalic, without obvious abnormality, atraumatic Neck: no adenopathy, supple, symmetrical, trachea midline and thyroid normal to inspection and palpation Lungs: clear to auscultation bilaterally Breasts: normal appearance, no masses or tenderness, No nipple retraction or dimpling, No nipple discharge or bleeding, No axillary adenopathy Heart: regular rate and rhythm Abdomen: soft, non-tender; no masses, no organomegaly Extremities: extremities normal, atraumatic, no cyanosis or edema Skin: skin color, texture, turgor normal. No rashes or lesions Lymph nodes: cervical, supraclavicular, and axillary nodes normal. Neurologic: grossly normal  Pelvic: External genitalia:  no lesions              No abnormal inguinal nodes palpated.              Urethra:  normal appearing urethra with no masses, tenderness or lesions              Bartholins and Skenes: normal  Vagina: normal appearing vagina with normal color and discharge, no lesions              Cervix: no lesions              Pap taken: No. Bimanual Exam:  Uterus:  normal size, contour, position, consistency, mobility, non-tender              Adnexa: no mass, fullness, tenderness              Rectal exam: Yes.  .  Confirms.              Anus:  normal sphincter tone, no lesions  Chaperone was present for exam.  Assessment:   Well woman visit with normal exam. Mirena IUD patient. Low vit D.  Hx frequent yeast infections. Umbilical hernia.   Plan: Mammogram screening discussed. Self breast awareness reviewed. Pap and HR HPV in 2024.  Guidelines for Calcium, Vitamin D, regular exercise program including cardiovascular and weight bearing exercise. Rx for probiotics.   Check vit D level now.  We discussed removal and reinsertion of new Mirena next year. Follow up annually and prn.   After visit summary provided.

## 2019-08-18 NOTE — Patient Instructions (Signed)

## 2019-08-19 LAB — VITAMIN D 25 HYDROXY (VIT D DEFICIENCY, FRACTURES): Vit D, 25-Hydroxy: 19.7 ng/mL — ABNORMAL LOW (ref 30.0–100.0)

## 2019-08-21 DIAGNOSIS — Z23 Encounter for immunization: Secondary | ICD-10-CM | POA: Diagnosis not present

## 2019-08-22 NOTE — Addendum Note (Signed)
Addended by: Yisroel Ramming, Dietrich Pates E on: 08/22/2019 05:29 PM   Modules accepted: Orders

## 2019-08-24 ENCOUNTER — Ambulatory Visit (HOSPITAL_BASED_OUTPATIENT_CLINIC_OR_DEPARTMENT_OTHER)
Admission: RE | Admit: 2019-08-24 | Discharge: 2019-08-24 | Disposition: A | Discharge status: Home / Self Care | Payer: BC Managed Care – PPO | Source: Ambulatory Visit | Attending: Obstetrics and Gynecology | Admitting: Obstetrics and Gynecology

## 2019-08-24 ENCOUNTER — Other Ambulatory Visit: Payer: Self-pay

## 2019-08-24 DIAGNOSIS — Z1231 Encounter for screening mammogram for malignant neoplasm of breast: Secondary | ICD-10-CM

## 2019-09-09 ENCOUNTER — Telehealth: Payer: BC Managed Care – PPO | Admitting: Family

## 2019-09-09 DIAGNOSIS — Z20822 Contact with and (suspected) exposure to covid-19: Secondary | ICD-10-CM

## 2019-09-09 NOTE — Progress Notes (Signed)
E-Visit for .Corona. Virus Screening   Your current symptoms could be consistent with the coronavirus.  Many health care providers can now test patients at their office but not all are.  [place] has multiple testing sites. For information on our COVID testing locations and hours go to [email or web address]  Please quarantine yourself while awaiting your test results.  We are enrolling you in our MyChart Home [facility] for COVID19 . Daily you will receive a questionnaire within the MyChart website. Our COVID 19 response team willl be monitoriing your responses daily. Please continue good preventive care measures, including:  frequent hand-washing, avoid touching your face, cover coughs/sneezes, stay out of crowds and keep a 6 foot distance from others.    COVID-19 is a respiratory illness with symptoms that are similar to the flu. Symptoms are typically mild to moderate, but there have been cases of severe illness and death due to the virus. The following symptoms may appear 2-14 days after exposure: . Fever . Cough . Shortness of breath or difficulty breathing . Chills . Repeated shaking with chills . Muscle pain . Headache . Sore throat . New loss of taste or smell . Fatigue . Congestion or runny nose . Nausea or vomiting . Diarrhea  If you develop fever/cough/breathlessness, please stay home for 10 days with improving symptoms and until you have had 24 hours of no fever (without taking a fever reducer).  Go to the nearest hospital ED for assessment if fever/cough/breathlessness are severe or illness seems like a threat to life.  It is vitally important that if you feel that you have an infection such as this virus or any other virus that you stay home and away from places where you may spread it to others.  You should avoid contact with people age 65 and older.   You should wear a mask or cloth face covering over your nose and mouth if you must be around other  people or animals, including pets (even at home). Try to stay at least 6 feet away from other people. This will protect the people around you.  You may also take acetaminophen (Tylenol) as needed for fever.   Reduce your risk of any infection by using the same precautions used for avoiding the common cold or flu:  . Wash your hands often with soap and warm water for at least 20 seconds.  If soap and water are not readily available, use an alcohol-based hand sanitizer with at least 60% alcohol.  . If coughing or sneezing, cover your mouth and nose by coughing or sneezing into the elbow areas of your shirt or coat, into a tissue or into your sleeve (not your hands). . Avoid shaking hands with others and consider head nods or verbal greetings only. . Avoid touching your eyes, nose, or mouth with unwashed hands.  . Avoid close contact with people who are sick. . Avoid places or events with large numbers of people in one location, like concerts or sporting events. . Carefully consider travel plans you have or are making. . If you are planning any travel outside or inside the [place], visit the CDC's Travelers' Health webpage for the latest health notices. . If you have some symptoms but not all symptoms, continue to monitor at home and seek medical attention if your symptoms worsen. . If you are having a medical emergency, call 911.  HOME CARE . Only take medications as instructed by your medical team. . Drink plenty of fluids and get   plenty of rest. . A steam or ultrasonic humidifier can help if you have congestion.   GET HELP RIGHT AWAY IF YOU HAVE EMERGENCY WARNING SIGNS** FOR COVID-19. If you or someone is showing any of these signs seek emergency medical care immediately. Call 911 or proceed to your closest emergency facility if: . You develop worsening high fever. . Trouble breathing . Bluish lips or face . Persistent pain or pressure in the chest . New confusion . Inability to wake or stay  awake . You cough up blood. . Your symptoms become more severe  **This list is not all possible symptoms. Contact your medical provider for any symptoms that are sever or concerning to you.   MAKE SURE YOU   Understand these instructions.  Will watch your condition.  Will get help right away if you are not doing well or get worse.  Your e-visit answers were reviewed by a board certified advanced clinical practitioner to complete your personal care plan.  Depending on the condition, your plan could have included both over the counter or prescription medications.  If there is a problem please reply once you have received a response from your provider.  Your safety is important to us.  If you have drug allergies check your prescription carefully.    You can use MyChart to ask questions about today's visit, request a non-urgent call back, or ask for a work or school excuse for 24 hours related to this e-Visit. If it has been greater than 24 hours you will need to follow up with your provider, or enter a new e-Visit to address those concerns. You will get an e-mail in the next two days asking about your experience.  I hope that your e-visit has been valuable and will speed your recovery. Thank you for using e-visits.   Greater than 5 minutes, yet less than 10 minutes of time have been spent researching, coordinating, and implementing care for this patient today.  Thank you for the details you included in the comment boxes. Those details are very helpful in determining the best course of treatment for you and help us to provide the best care.  

## 2019-09-13 DIAGNOSIS — U071 COVID-19: Secondary | ICD-10-CM

## 2019-09-13 HISTORY — DX: COVID-19: U07.1

## 2019-09-15 DIAGNOSIS — R05 Cough: Secondary | ICD-10-CM | POA: Diagnosis not present

## 2019-09-15 DIAGNOSIS — U071 COVID-19: Secondary | ICD-10-CM | POA: Diagnosis not present

## 2019-09-15 MED FILL — BENZONATATE 100 MG CAPS: 100 | 10 days supply | Qty: 30 | Fill #0

## 2019-09-16 MED FILL — ALBUTEROL SULFATE HFA 108 (: 108 (90 BAS | 30 days supply | Qty: 9 | Fill #0

## 2019-09-18 ENCOUNTER — Encounter: Payer: Self-pay | Admitting: Emergency Medicine

## 2019-09-18 ENCOUNTER — Emergency Department (INDEPENDENT_AMBULATORY_CARE_PROVIDER_SITE_OTHER): Payer: BC Managed Care – PPO

## 2019-09-18 ENCOUNTER — Telehealth: Payer: BC Managed Care – PPO | Admitting: Family

## 2019-09-18 ENCOUNTER — Other Ambulatory Visit: Payer: Self-pay

## 2019-09-18 ENCOUNTER — Emergency Department (INDEPENDENT_AMBULATORY_CARE_PROVIDER_SITE_OTHER)
Admission: EM | Admit: 2019-09-18 | Discharge: 2019-09-18 | Disposition: A | Discharge status: Home / Self Care | Payer: BC Managed Care – PPO | Source: Home / Self Care | Attending: Family Medicine | Admitting: Family Medicine

## 2019-09-18 DIAGNOSIS — R05 Cough: Secondary | ICD-10-CM | POA: Diagnosis not present

## 2019-09-18 DIAGNOSIS — R0602 Shortness of breath: Secondary | ICD-10-CM

## 2019-09-18 DIAGNOSIS — R0789 Other chest pain: Secondary | ICD-10-CM

## 2019-09-18 DIAGNOSIS — J069 Acute upper respiratory infection, unspecified: Secondary | ICD-10-CM

## 2019-09-18 DIAGNOSIS — B9789 Other viral agents as the cause of diseases classified elsewhere: Secondary | ICD-10-CM | POA: Diagnosis not present

## 2019-09-18 DIAGNOSIS — Z9189 Other specified personal risk factors, not elsewhere classified: Secondary | ICD-10-CM

## 2019-09-18 DIAGNOSIS — Z20828 Contact with and (suspected) exposure to other viral communicable diseases: Secondary | ICD-10-CM

## 2019-09-18 DIAGNOSIS — Z20822 Contact with and (suspected) exposure to covid-19: Secondary | ICD-10-CM

## 2019-09-18 LAB — POC SARS CORONAVIRUS 2 AG -  ED: SARS Coronavirus 2 Ag: NEGATIVE

## 2019-09-18 NOTE — Progress Notes (Signed)
Based on what you shared with me, I feel your condition warrants further evaluation and I recommend that you be seen for a face to face office visit.  Given your shortness of breath you need to be seen face to face for chest x-ray today. Any are sufficent and you can see a list below.    NOTE: If you entered your credit card information for this eVisit, you will not be charged. You may see a "hold" on your card for the $35 but that hold will drop off and you will not have a charge processed.   If you are having a true medical emergency please call 911.      For an urgent face to face visit, [place] has five urgent care centers for your convenience:      NEW:  [facility] [place] at [place] Get Driving Directions [phone] [place], [place] [address] . 10 am - 6pm Monday - Friday    [facility] [facility]) Get Driving Directions [phone] [address] [place], [place] [address] . 10 am to 8 pm Monday-Friday . 12 pm to 8 pm [facility] at [facility] Get Driving Directions [phone] [place], [place], [place] [address] . 8 am to 8 pm Monday-Friday . 9 am to 6 pm Saturday . 11 am to 6 pm Sunday     [facility] at [facility] Get Driving Directions  [phone] [address] [place], [place] [address] . 8 am to 8 pm Monday-Friday . 8 am to 4 pm [facility] at [place] Get Driving Directions [phone] [address] [place], [place] [address] . 12 pm to 6 pm Monday-Friday      Your e-visit answers were reviewed by a board certified advanced clinical practitioner to complete your personal care plan.  Thank you for using e-Visits.

## 2019-09-18 NOTE — ED Triage Notes (Signed)
Patient's entire immediate family is currently covid positive; she began having symptoms on 09/07/19 and 3 days ago reported intermittent shortness of breath; her pcp had her monitor O2 sats and today she hit 23; was told to come for evaluation and possible chest xray.  She has had influenza vacc this season.

## 2019-09-18 NOTE — ED Provider Notes (Signed)
[facility]    CSN: [id number] Arrival date & time: 09/18/19  1438      History   Chief Complaint Chief Complaint  Patient presents with  . Shortness of Breath  . exposure covid    HPI Susan Powers is a 48 y.o. female.   Patient developed fatigue, headache, and cough on 09/07/19.  She had an e-visit to discuss her symptoms on 09/07/19.  She reports that her entire family has [place].  On 09/15/19 she began developing shortness of breath and she consulted with her PCP via e-visit.  She was prescribed Tessalon, albuterol inhaler, and pulse oximeter.  She was advised to seek evaluation if her SpO2 reached 93.  She developed increasing chest tightness and intermittent shortness of breath.  On 09/18/19 her SpO2 was ranging between 94-96, and her PCP recommended in person evaluation.   The history is provided by the patient.    Past Medical History:  Diagnosis Date  . IUD (intrauterine device) in place   . Vitamin D deficiency     Patient Active Problem List   Diagnosis Date Noted  . Upper back pain 12/12/2014  . Vitamin D deficiency 12/07/2013  . Adjustment disorder with mixed anxiety and depressed mood 04/12/2013  . Seasonal affective disorder ([place]) 10/18/2012  . Abdominal hernia 10/18/2012  . H/O vitamin D deficiency 07/15/2012  . Muscle spasm 07/15/2012  . Tinea 07/15/2012    Past Surgical History:  Procedure Laterality Date  . DILATION AND CURETTAGE OF UTERUS  2003   MAB  . mirena      OB History    Gravida  3   Para      Term      Preterm      AB  1   Living  2     SAB  1   TAB      Ectopic      Multiple      Live Births               Home Medications    Prior to Admission medications   Medication Sig Start Date End Date Taking? Authorizing Provider  benzonatate (TESSALON) 100 MG capsule Take by mouth 3 (three) times daily as needed for cough.   Yes [provider]  levonorgestrel (MIRENA) 20 MCG/24HR IUD 1 each  by Intrauterine route once.    [provider]  NONFORMULARY OR COMPOUNDED ITEM Lactinex probiotics.  Take 4 tablets 4 times a day. 08/18/19   Nunzio Cobbs, MD    Family History Family History  Problem Relation Age of Onset  . Arthritis Mother   . Osteoarthritis Mother   . Arthritis Paternal Grandmother     Social History Social History   Tobacco Use  . Smoking status: Never Smoker  . Smokeless tobacco: Never Used  Substance Use Topics  . Alcohol use: Yes    Alcohol/week: 1.0 - 2.0 standard drinks    Types: 1 - 2 Standard drinks or equivalent per week  . Drug use: No     Allergies   Patient has no known allergies.   Review of Systems Review of Systems + sore throat + cough No pleuritic pain but feels tight in anterior chest No wheezing No nasal congestion No post-nasal drainage No sinus pain/pressure No itchy/red eyes No earache No hemoptysis + SOB + low grade fever No nausea No vomiting No abdominal pain No diarrhea No urinary symptoms No skin rash +  fatigue No myalgias + headache Used OTC meds without relief   Physical Exam Triage Vital Signs ED Triage Vitals  Enc Vitals Group     BP      Pulse      Resp      Temp      Temp src      SpO2      Weight      Height      Head Circumference      Peak Flow      Pain Score      Pain Loc      Pain Edu?      Excl. in [employer]?    No data found.  Updated Vital Signs BP 123/85 (BP Location: Left Arm)   Pulse 92   Temp 98.1 F (36.7 C) (Oral)   Resp 16   Ht 5' 6"  (1.676 m)   Wt 67 kg   SpO2 97%   BMI 23.84 kg/m   Visual Acuity Right Eye Distance:   Left Eye Distance:   Bilateral Distance:    Right Eye Near:   Left Eye Near:    Bilateral Near:     Physical Exam Chest:     Nursing notes and Vital Signs reviewed. Appearance:  Patient appears stated age, and in no acute distress Eyes:  Pupils are equal, round, and reactive to light and accomodation.  Extraocular  movement is intact.  Conjunctivae are not inflamed  Ears:  Canals normal.  Tympanic membranes normal.  Nose:  Mildly congested turbinates.  No sinus tenderness.  Pharynx:  Normal Neck:  Supple. Lungs:  Clear to auscultation.  Breath sounds are equal.  Moving air well. Chest:  Distinct tenderness to palpation over the left lower sternum (see diagram above). Heart:  Regular rate and rhythm without murmurs, rubs, or gallops.  Abdomen:  Nontender without masses or hepatosplenomegaly.  Bowel sounds are present.  No CVA or flank tenderness.  Extremities:  No edema.  Skin:  No rash present.    UC Treatments / Results  Labs (all labs ordered are listed, but only abnormal results are displayed) Labs Reviewed  SARS-COV-2 RNA,(COVID-19) QUALITATIVE NAAT  POC SARS CORONAVIRUS 2 AG -  ED negative    EKG   Radiology Dg Chest 2 View  Result Date: 09/18/2019 CLINICAL DATA:  Cough EXAM: CHEST - 2 VIEW COMPARISON:  None. FINDINGS: The heart size and mediastinal contours are within normal limits. Both lungs are clear. The visualized skeletal structures are unremarkable. IMPRESSION: No active cardiopulmonary disease. Electronically Signed   By: Zerita Boers M.D.   On: 09/18/2019 16:55    Procedures Procedures (including critical care time)  Medications Ordered in UC Medications - No data to display  Initial Impression / Assessment and Plan / UC Course  I have reviewed the triage vital signs and the nursing notes.  Pertinent labs & imaging results that were available during my care of the patient were reviewed by me and considered in my medical decision making (see chart for details).    Normal chest x-ray reassuring.  Treat symptomatically for now. COVID19 send out. Patient advised to continue to monitor her SpO2. If symptoms become significantly worse during the night or over the weekend, proceed to the local emergency room.    Final Clinical Impressions(s) / UC Diagnoses   Final  diagnoses:  At increased risk of exposure to COVID-19 virus  Viral URI with cough     Discharge Instructions  Take plain guaifenesin (122m extended release tabs such as Mucinex) twice daily, with plenty of water, for cough and congestion.  May add phenylephrine or Pseudoephedrine for sinus congestion.  Get adequate rest.   May use Afrin nasal spray (or generic oxymetazoline) each morning for about 5 days and then discontinue.  Also recommend using saline nasal spray several times daily and saline nasal irrigation (AYR is a common brand).  Use Flonase nasal spray each morning after using Afrin nasal spray and saline nasal irrigation. Try warm salt water gargles for sore throat.  Stop all antihistamines for now, and other non-prescription cough/cold preparations. May take Ibuprofen 2077m 4 tabs every 8 hours with food for chest/sternum discomfort, headache, etc. Continue albuterol inhaler as needed. May take Tessalon 10067m2 perles at bedtime for cough.  Isolate yourself until COVID-19 test result is available.   If COVID-19 test is positive, isolate yourself until the below conditions are met: 1)  At least 7 days since symptoms onset. AND 2)  > 72 hours after symptom resolution (absence of fever without the use of fever-reducing medicine, and improvement in respiratory symptoms.        ED Prescriptions    None        BeeKandra NicolasD 09/23/19 1128

## 2019-09-18 NOTE — Discharge Instructions (Addendum)
Take plain guaifenesin (1214m extended release tabs such as Mucinex) twice daily, with plenty of water, for cough and congestion.  May add phenylephrine or Pseudoephedrine for sinus congestion.  Get adequate rest.   May use Afrin nasal spray (or generic oxymetazoline) each morning for about 5 days and then discontinue.  Also recommend using saline nasal spray several times daily and saline nasal irrigation (AYR is a common brand).  Use Flonase nasal spray each morning after using Afrin nasal spray and saline nasal irrigation. Try warm salt water gargles for sore throat.  Stop all antihistamines for now, and other non-prescription cough/cold preparations. May take Ibuprofen 2060m 4 tabs every 8 hours with food for chest/sternum discomfort, headache, etc. Continue albuterol inhaler as needed. May take Tessalon 10075m2 perles at bedtime for cough.  Isolate yourself until COVID-19 test result is available.   If COVID-19 test is positive, isolate yourself until the below conditions are met: 1)  At least 7 days since symptoms onset. AND 2)  > 72 hours after symptom resolution (absence of fever without the use of fever-reducing medicine, and improvement in respiratory symptoms.

## 2019-09-21 LAB — SARS-COV-2 RNA,(COVID-19) QUALITATIVE NAAT: SARS CoV2 RNA: DETECTED — AB

## 2019-09-23 ENCOUNTER — Encounter (HOSPITAL_COMMUNITY): Payer: Self-pay

## 2019-09-23 ENCOUNTER — Telehealth (HOSPITAL_COMMUNITY): Payer: Self-pay | Admitting: Emergency Medicine

## 2019-09-23 MED FILL — BENZONATATE 100 MG CAP: 100 | 30 days supply | Qty: 90 | Fill #0

## 2019-09-23 NOTE — Telephone Encounter (Signed)

## 2019-09-27 DIAGNOSIS — R05 Cough: Secondary | ICD-10-CM | POA: Diagnosis not present

## 2019-09-27 DIAGNOSIS — U071 COVID-19: Secondary | ICD-10-CM | POA: Diagnosis not present

## 2019-12-27 ENCOUNTER — Telehealth: Payer: Self-pay | Admitting: Obstetrics and Gynecology

## 2019-12-27 DIAGNOSIS — R7989 Other specified abnormal findings of blood chemistry: Secondary | ICD-10-CM

## 2019-12-27 DIAGNOSIS — Z3009 Encounter for other general counseling and advice on contraception: Secondary | ICD-10-CM

## 2019-12-27 NOTE — Telephone Encounter (Signed)
Please contact patient and let her know that she is due for a vitamin D recheck.   She came up in my reminder box in Epic.

## 2019-12-27 NOTE — Telephone Encounter (Signed)
Ok for 5 year schedule for exchange for Mirena IUD.  Will need to go to precert first.

## 2019-12-27 NOTE — Telephone Encounter (Signed)
Left message to call Noreene Larsson, RN at [facility] [phone].    1. Orders placed for Mirena IUD exchange. Business office to precert and then schedule.   2. Patient can go for Vit D lab at her convenience, order placed.  [facility]  [address], [address] [place], [place] [address] [phone]

## 2019-12-27 NOTE — Telephone Encounter (Signed)
Spoke with patient.   1. Patient request to schedule Lab at outside lab facility in [facility]. Advised patient I will confirm [facility] locations in [facility] and return call with additional info and instructions.  Future lab order placed and released for outside [place] facility.   2. Patient asking when Mirena IUD is due for exchange? Mirena IUD placed 02/12/15. Advised patient Mirena IUD is now approved for 80yrs, would be due 02/11/2021. Patient states she would prefer to schedule at 5 yrs. Advised I will review with Dr. Edward Jolly and f/u.   Last AEX 08/18/19   Dr. Edward Jolly- ok to proceed with Mirena IUD exchange precert?

## 2019-12-28 NOTE — Telephone Encounter (Signed)
Spoke to pt. Pt given update on Vit D to be drawn at [facility] site in [facility] as stated by Noreene Larsson, Charity fundraiser. Pt agreeable and thankful for close place to get it done.   Routing to Dr Edward Jolly for review and will close encounter.

## 2019-12-28 NOTE — Telephone Encounter (Signed)
Call placed to convey benefits for Mirena exchange. Spoke with the patient and conveyed the benefits. Patient understands/agreeable with the benefits.Patient is aware of the cancellation policy. Appointment scheduled 02/14/20@4pm  with Dr. Edward Jolly. Please call patient she has questions regarding her vit d.

## 2020-01-20 ENCOUNTER — Other Ambulatory Visit: Payer: Self-pay | Admitting: Obstetrics and Gynecology

## 2020-01-23 ENCOUNTER — Other Ambulatory Visit: Payer: Self-pay | Admitting: Obstetrics and Gynecology

## 2020-01-25 DIAGNOSIS — R7989 Other specified abnormal findings of blood chemistry: Secondary | ICD-10-CM | POA: Diagnosis not present

## 2020-01-25 MED FILL — ALBUTEROL SULFATE HFA 108 (: 108 (90 BAS | 30 days supply | Qty: 9 | Fill #1

## 2020-01-26 LAB — VITAMIN D 25 HYDROXY (VIT D DEFICIENCY, FRACTURES): Vit D, 25-Hydroxy: 28.1 ng/mL — ABNORMAL LOW (ref 30.0–100.0)

## 2020-02-01 ENCOUNTER — Telehealth: Payer: Self-pay

## 2020-02-01 MED ORDER — NONFORMULARY OR COMPOUNDED ITEM: 7 refills | Status: DC

## 2020-02-01 NOTE — Telephone Encounter (Signed)
Pharmacy called in regards to a prescription of Latinex for patient. Pharmacy stated prescription was never received and would like to speak with someone to clarify prescription and possible refill.

## 2020-02-01 NOTE — Telephone Encounter (Signed)
Spoke with pt.   Pt asking about Lactinex Rx . Pt and pharmacy reports Rx was not sent or received.   Reviewed AEX notes from 08/18/19 with Dr Edward Jolly and pt has current active Rx of Lactinex for 1 year, but was not received by pharmacy on file per record.    Pt aware that new Rx sent to pharmacy with refills to get to AEX.  Pt has next AEX 08/22/20 with Dr Edward Jolly.   Routing to Dr Edward Jolly for review.  Encounter closed.

## 2020-02-02 MED FILL — LACTINEX CHEWABLE TABLET: 9 days supply | Qty: 150 | Fill #0

## 2020-02-13 ENCOUNTER — Other Ambulatory Visit: Payer: Self-pay

## 2020-02-14 ENCOUNTER — Encounter: Payer: Self-pay | Admitting: Obstetrics and Gynecology

## 2020-02-14 ENCOUNTER — Other Ambulatory Visit: Payer: Self-pay

## 2020-02-14 ENCOUNTER — Ambulatory Visit (INDEPENDENT_AMBULATORY_CARE_PROVIDER_SITE_OTHER): Payer: BC Managed Care – PPO | Admitting: Obstetrics and Gynecology

## 2020-02-14 VITALS — BP 102/70 | HR 80 | Temp 97.4°F | Ht 66.0 in | Wt 146.8 lb

## 2020-02-14 DIAGNOSIS — Z30433 Encounter for removal and reinsertion of intrauterine contraceptive device: Secondary | ICD-10-CM

## 2020-02-14 DIAGNOSIS — E559 Vitamin D deficiency, unspecified: Secondary | ICD-10-CM | POA: Diagnosis not present

## 2020-02-14 DIAGNOSIS — Z01812 Encounter for preprocedural laboratory examination: Secondary | ICD-10-CM | POA: Diagnosis not present

## 2020-02-14 DIAGNOSIS — Z0000 Encounter for general adult medical examination without abnormal findings: Secondary | ICD-10-CM | POA: Diagnosis not present

## 2020-02-14 DIAGNOSIS — Z3009 Encounter for other general counseling and advice on contraception: Secondary | ICD-10-CM

## 2020-02-14 DIAGNOSIS — Z13220 Encounter for screening for lipoid disorders: Secondary | ICD-10-CM | POA: Diagnosis not present

## 2020-02-14 LAB — POCT URINE PREGNANCY: Preg Test, Ur: NEGATIVE

## 2020-02-14 NOTE — Progress Notes (Signed)
GYNECOLOGY  VISIT   HPI: 49 y.o.   Married  Caucasian  female   [phone] with No LMP recorded (lmp unknown). (Menstrual status: IUD).   here for Mirena IUD exchange.   She does not have menstrual cycles with Mirena IUD.    Had Covid in December.    UPT negative.   GYNECOLOGIC HISTORY: No LMP recorded (lmp unknown). (Menstrual status: IUD). Contraception:  Mirena IUD inserted 02/12/15 Menopausal hormone therapy:  none Last mammogram:  08/24/19 BIRADS 1 negative/density d Last pap smear:   09/22/18 Neg:Neg HR HPV        OB History    Gravida  3   Para      Term      Preterm      AB  1   Living  2     SAB  1   TAB      Ectopic      Multiple      Live Births                 Patient Active Problem List   Diagnosis Date Noted  . Upper back pain 12/12/2014  . Vitamin D deficiency 12/07/2013  . Adjustment disorder with mixed anxiety and depressed mood 04/12/2013  . Seasonal affective disorder ([facility]) 10/18/2012  . Abdominal hernia 10/18/2012  . H/O vitamin D deficiency 07/15/2012  . Muscle spasm 07/15/2012  . Tinea 07/15/2012    Past Medical History:  Diagnosis Date  . IUD (intrauterine device) in place   . Vitamin D deficiency     Past Surgical History:  Procedure Laterality Date  . DILATION AND CURETTAGE OF UTERUS  2003   MAB  . mirena      Current Outpatient Medications  Medication Sig Dispense Refill  . levonorgestrel (MIRENA) 20 MCG/24HR IUD 1 each by Intrauterine route once.    . NONFORMULARY OR COMPOUNDED ITEM Lactinex probiotics.  Take 4 tablets 4 times a day. 4 each 7   No current facility-administered medications for this visit.     ALLERGIES: Patient has no known allergies.  Family History  Problem Relation Age of Onset  . Arthritis Mother   . Osteoarthritis Mother   . Arthritis Paternal Grandmother     Social History   Socioeconomic History  . Marital status: Married    Spouse name: Not on file  . Number of children: Not  on file  . Years of education: Not on file  . Highest education level: Not on file  Occupational History  . Not on file  Tobacco Use  . Smoking status: Never Smoker  . Smokeless tobacco: Never Used  Substance and Sexual Activity  . Alcohol use: Yes    Alcohol/week: 1.0 - 2.0 standard drinks    Types: 1 - 2 Standard drinks or equivalent per week  . Drug use: No  . Sexual activity: Yes    Partners: Male    Birth control/protection: I.U.D.    Comment: Mirena inserted 02-12-15  Other Topics Concern  . Not on file  Social History Narrative  . Not on file   Social Determinants of Health   Financial Resource Strain:   . Difficulty of Paying Living Expenses:   Food Insecurity:   . Worried About Programme researcher, broadcasting/film/video in the Last Year:   . Barista in the Last Year:   Transportation Needs:   . Freight forwarder (Medical):   Marland Kitchen Lack of Transportation (Non-Medical):   Physical  Activity:   . Days of Exercise per Week:   . Minutes of Exercise per Session:   Stress:   . Feeling of Stress :   Social Connections:   . Frequency of Communication with Friends and Family:   . Frequency of Social Gatherings with Friends and Family:   . Attends Religious Services:   . Active Member of Clubs or Organizations:   . Attends Archivist Meetings:   Marland Kitchen Marital Status:   Intimate Partner Violence:   . Fear of Current or Ex-Partner:   . Emotionally Abused:   Marland Kitchen Physically Abused:   . Sexually Abused:     Review of Systems  Constitutional: Negative.   HENT: Negative.   Eyes: Negative.   Respiratory: Negative.   Cardiovascular: Negative.   Gastrointestinal: Negative.   Endocrine: Negative.   Genitourinary: Negative.   Musculoskeletal: Negative.   Skin: Negative.   Allergic/Immunologic: Negative.   Neurological: Negative.   Hematological: Negative.   Psychiatric/Behavioral: Negative.     PHYSICAL EXAMINATION:    BP 102/70 (BP Location: Right Arm, Patient Position:  Sitting, Cuff Size: Normal)   Pulse 80   Temp (!) 97.4 F (36.3 C) (Temporal)   Ht 5\' 6"  (1.676 m)   Wt 146 lb 12.8 oz (66.6 kg)   LMP  (LMP Unknown)   BMI 23.69 kg/m     General appearance: alert, cooperative and appears stated age   Pelvic: External genitalia:  no lesions              Urethra:  normal appearing urethra with no masses, tenderness or lesions              Bartholins and Skenes: normal                 Vagina: normal appearing vagina with normal color and discharge, no lesions              Cervix: no lesions                Bimanual Exam:  Uterus:  normal size, contour, position, consistency, mobility, non-tender              Adnexa: no mass, fullness, tenderness     Mirena IUD exchange. Consent for procedures.  Sterile prep with Hibiclens.  IUD removed with ring forceps, intact, shown to patient, and discarded. Paracervical block - 10 cc 1% lidocaine lot number [id number], 09/12/20.  Tenaculum to anterior cervical lip.  Uterus sounded to almost 8 cm.  Mirena IUD - lot number [id number], esp July 2023 placed without difficulty.  When the strings were trimmed, the scissors pulled the IUD out due to a suspected jagged edge of the scissors.  New Mirena IUD placed using same technique.  Mirena IUD - lot number [id number], exp May 2023. Strings trimmed.  No complications.  EBL 20 cc.   Chaperone was present for exam.  ASSESSMENT  Mirena IUD exchange.    PLAN  IUD instructions and precautions to patient.  Back up protection for 1 week.  IUD card to patient.  FU in 4 weeks.    An After Visit Summary was printed and given to the patient.

## 2020-02-14 NOTE — Patient Instructions (Signed)
Intrauterine Device Insertion, Care After  This sheet gives you information about how to care for yourself after your procedure. Your health care provider may also give you more specific instructions. If you have problems or questions, contact your health care provider. What can I expect after the procedure? After the procedure, it is common to have:  Cramps and pain in the abdomen.  Light bleeding (spotting) or heavier bleeding that is like your menstrual period. This may last for up to a few days.  Lower back pain.  Dizziness.  Headaches.  Nausea. Follow these instructions at home:  Before resuming sexual activity, check to make sure that you can feel the IUD string(s). You should be able to feel the end of the string(s) below the opening of your cervix. If your IUD string is in place, you may resume sexual activity. ? If you had a hormonal IUD inserted more than 7 days after your most recent period started, you will need to use a backup method of birth control for 7 days after IUD insertion. Ask your health care provider whether this applies to you.  Continue to check that the IUD is still in place by feeling for the string(s) after every menstrual period, or once a month.  Take over-the-counter and prescription medicines only as told by your health care provider.  Do not drive or use heavy machinery while taking prescription pain medicine.  Keep all follow-up visits as told by your health care provider. This is important. Contact a health care provider if:  You have bleeding that is heavier or lasts longer than a normal menstrual cycle.  You have a fever.  You have cramps or abdominal pain that get worse or do not get better with medicine.  You develop abdominal pain that is new or is not in the same area of earlier cramping and pain.  You feel lightheaded or weak.  You have abnormal or bad-smelling discharge from your vagina.  You have pain during sexual  activity.  You have any of the following problems with your IUD string(s): ? The string bothers or hurts you or your sexual partner. ? You cannot feel the string. ? The string has gotten longer.  You can feel the IUD in your vagina.  You think you may be pregnant, or you miss your menstrual period.  You think you may have an STI (sexually transmitted infection). Get help right away if:  You have flu-like symptoms.  You have a fever and chills.  You can feel that your IUD has slipped out of place. Summary  After the procedure, it is common to have cramps and pain in the abdomen. It is also common to have light bleeding (spotting) or heavier bleeding that is like your menstrual period.  Continue to check that the IUD is still in place by feeling for the string(s) after every menstrual period, or once a month.  Keep all follow-up visits as told by your health care provider. This is important.  Contact your health care provider if you have problems with your IUD string(s), such as the string getting longer or bothering you or your sexual partner. This information is not intended to replace advice given to you by your health care provider. Make sure you discuss any questions you have with your health care provider. Document Revised: 09/11/2017 Document Reviewed: 08/20/2016 Elsevier Patient Education  2020 [employer]  

## 2020-02-16 MED FILL — VIT D2 1.25 MG (50,000 UNIT: 1.25 MG | 56 days supply | Qty: 8 | Fill #0

## 2020-03-13 ENCOUNTER — Other Ambulatory Visit: Payer: Self-pay

## 2020-03-14 ENCOUNTER — Encounter: Payer: Self-pay | Admitting: Obstetrics and Gynecology

## 2020-03-14 ENCOUNTER — Ambulatory Visit (INDEPENDENT_AMBULATORY_CARE_PROVIDER_SITE_OTHER): Payer: BC Managed Care – PPO | Admitting: Obstetrics and Gynecology

## 2020-03-14 VITALS — BP 100/64 | HR 70 | Temp 97.3°F | Ht 66.0 in | Wt 148.2 lb

## 2020-03-14 DIAGNOSIS — Z30431 Encounter for routine checking of intrauterine contraceptive device: Secondary | ICD-10-CM | POA: Diagnosis not present

## 2020-03-14 NOTE — Progress Notes (Signed)
GYNECOLOGY  VISIT   HPI: 49 y.o.   Married  Caucasian  female   [phone] with No LMP recorded (lmp unknown). (Menstrual status: IUD).   here for 4 week follow up after IUD insertion.   Doing well with Mirena.  No bleeding, pain, or pain with intercourse.   Sweaty at night.  Doing ok and not uncomfortable.   Her PCP is now treating her with vit D 50,000 IU weekly for 8 weeks.  Her Vit D was 18. Not eating dairy.  Completed her Covid vaccine.   Has taken an early retirement from work.  GYNECOLOGIC HISTORY: No LMP recorded (lmp unknown). (Menstrual status: IUD). Contraception:  Mirena IUD 02-14-20 Menopausal hormone therapy:  none Last mammogram: 08/24/19 BIRADS 1 negative/density d Last pap smear:  09/22/18 Neg:Neg HR HPV, 10-03-16 Neg:Neg HR HPV        OB History    Gravida  3   Para      Term      Preterm      AB  1   Living  2     SAB  1   TAB      Ectopic      Multiple      Live Births                 Patient Active Problem List   Diagnosis Date Noted  . Upper back pain 12/12/2014  . Vitamin D deficiency 12/07/2013  . Adjustment disorder with mixed anxiety and depressed mood 04/12/2013  . Seasonal affective disorder (HCC) 10/18/2012  . Abdominal hernia 10/18/2012  . H/O vitamin D deficiency 07/15/2012  . Muscle spasm 07/15/2012  . Tinea 07/15/2012    Past Medical History:  Diagnosis Date  . COVID-19 09/2019  . IUD (intrauterine device) in place   . Vitamin D deficiency     Past Surgical History:  Procedure Laterality Date  . DILATION AND CURETTAGE OF UTERUS  2003   MAB  . mirena      Current Outpatient Medications  Medication Sig Dispense Refill  . levonorgestrel (MIRENA) 20 MCG/24HR IUD 1 each by Intrauterine route once.    . NONFORMULARY OR COMPOUNDED ITEM Lactinex probiotics.  Take 4 tablets 4 times a day. 4 each 7  . Vitamin D, Ergocalciferol, (DRISDOL) 1.25 MG (50000 UNIT) CAPS capsule Take 50,000 Units by mouth once a week.      No current facility-administered medications for this visit.     ALLERGIES: Patient has no known allergies.  Family History  Problem Relation Age of Onset  . Arthritis Mother   . Osteoarthritis Mother   . Arthritis Paternal Grandmother     Social History   Socioeconomic History  . Marital status: Married    Spouse name: Not on file  . Number of children: Not on file  . Years of education: Not on file  . Highest education level: Not on file  Occupational History  . Not on file  Tobacco Use  . Smoking status: Never Smoker  . Smokeless tobacco: Never Used  Substance and Sexual Activity  . Alcohol use: Yes    Alcohol/week: 1.0 - 2.0 standard drinks    Types: 1 - 2 Standard drinks or equivalent per week  . Drug use: No  . Sexual activity: Yes    Partners: Male    Birth control/protection: I.U.D.    Comment: Mirena inserted 02-12-15  Other Topics Concern  . Not on file  Social History Narrative  .  Not on file   Social Determinants of Health   Financial Resource Strain:   . Difficulty of Paying Living Expenses:   Food Insecurity:   . Worried About Charity fundraiser in the Last Year:   . Arboriculturist in the Last Year:   Transportation Needs:   . Film/video editor (Medical):   Marland Kitchen Lack of Transportation (Non-Medical):   Physical Activity:   . Days of Exercise per Week:   . Minutes of Exercise per Session:   Stress:   . Feeling of Stress :   Social Connections:   . Frequency of Communication with Friends and Family:   . Frequency of Social Gatherings with Friends and Family:   . Attends Religious Services:   . Active Member of Clubs or Organizations:   . Attends Archivist Meetings:   Marland Kitchen Marital Status:   Intimate Partner Violence:   . Fear of Current or Ex-Partner:   . Emotionally Abused:   Marland Kitchen Physically Abused:   . Sexually Abused:     Review of Systems  All other systems reviewed and are negative.   PHYSICAL EXAMINATION:    BP  100/64   Pulse 70   Temp (!) 97.3 F (36.3 C) (Temporal)   Ht 5\' 6"  (1.676 m)   Wt 148 lb 3.2 oz (67.2 kg)   LMP  (LMP Unknown)   BMI 23.92 kg/m     General appearance: alert, cooperative and appears stated age   Pelvic: External genitalia:  no lesions              Urethra:  normal appearing urethra with no masses, tenderness or lesions              Bartholins and Skenes: normal                 Vagina: normal appearing vagina with normal color and discharge, no lesions              Cervix: no lesions.  IUD string noted.                 Bimanual Exam:  Uterus:  normal size, contour, position, consistency, mobility, non-tender              Adnexa: no mass, fullness, tenderness             Chaperone was present for exam.  ASSESSMENT  IUD check up.  Doing well with Mirena.  PLAN  Reassurance regarding position of the IUD.  IUD effective for 6 years of use.  FU for annual exam and prn.    An After Visit Summary was printed and given to the patient.  __15____ minutes face to face time of which over 50% was spent in counseling.

## 2020-07-10 ENCOUNTER — Other Ambulatory Visit (HOSPITAL_BASED_OUTPATIENT_CLINIC_OR_DEPARTMENT_OTHER): Payer: Self-pay | Admitting: Internal Medicine

## 2020-07-10 DIAGNOSIS — Z1231 Encounter for screening mammogram for malignant neoplasm of breast: Secondary | ICD-10-CM

## 2020-07-27 ENCOUNTER — Encounter: Payer: Self-pay | Admitting: Obstetrics and Gynecology

## 2020-07-27 ENCOUNTER — Ambulatory Visit (INDEPENDENT_AMBULATORY_CARE_PROVIDER_SITE_OTHER): Payer: BC Managed Care – PPO | Admitting: Obstetrics and Gynecology

## 2020-07-27 ENCOUNTER — Other Ambulatory Visit: Payer: Self-pay

## 2020-07-27 ENCOUNTER — Other Ambulatory Visit (HOSPITAL_BASED_OUTPATIENT_CLINIC_OR_DEPARTMENT_OTHER): Payer: Self-pay | Admitting: Obstetrics and Gynecology

## 2020-07-27 VITALS — BP 128/62 | HR 69 | Resp 16 | Ht 66.0 in | Wt 145.0 lb

## 2020-07-27 DIAGNOSIS — Z01419 Encounter for gynecological examination (general) (routine) without abnormal findings: Secondary | ICD-10-CM

## 2020-07-27 DIAGNOSIS — Z23 Encounter for immunization: Secondary | ICD-10-CM

## 2020-07-27 MED ORDER — NONFORMULARY OR COMPOUNDED ITEM: 7 refills | Status: DC

## 2020-07-27 NOTE — Progress Notes (Signed)
49 y.o. G49P0012 Married Caucasian female here for annual exam.    Less energy.  Vit D is 28.1. Her vit D was 18.3 with her PCP in May, 2021. She took 50,000 IU weekly.  She is now on vit D 1000 mg per day. The remainder of her labs with her PCP were ok per patient.   IUD is doing well.   Retired from her work.  Looking for new work.   Had Covid vaccine in April.  PCP:   [facility].   No LMP recorded. (Menstrual status: IUD).           Sexually active: Yes.    The current method of family planning is IUD inserted 02/14/20.    Exercising: Yes.    Home exercise routine includes walking 5 hrs per week. Smoker:  no  Health Maintenance: Pap:  09/22/18 Neg:Neg HR HPV  10-03-16 Neg:Neg HR HPV History of abnormal Pap:  no MMG:  08/24/19 BIRADS 1 negative/density d -- scheduled 08/27/20.  Colonoscopy:  n/a BMD:   n/a  Result  n/a TDaP:  2011.   Gardasil:   no HIV:  Neg TDap. Hep C: no Screening Labs:  PCP   reports that she has never smoked. She has never used smokeless tobacco. She reports current alcohol use of about 1.0 - 2.0 standard drink of alcohol per week. She reports that she does not use drugs.  Past Medical History:  Diagnosis Date  . COVID-19 09/2019  . IUD (intrauterine device) in place   . Vitamin D deficiency     Past Surgical History:  Procedure Laterality Date  . DILATION AND CURETTAGE OF UTERUS  2003   MAB  . mirena      Current Outpatient Medications  Medication Sig Dispense Refill  . levonorgestrel (MIRENA) 20 MCG/24HR IUD 1 each by Intrauterine route once.    . NONFORMULARY OR COMPOUNDED ITEM Lactinex probiotics.  Take 4 tablets 4 times a day. 4 each 7  . Vitamin D, Ergocalciferol, (DRISDOL) 1.25 MG (50000 UNIT) CAPS capsule Take 50,000 Units by mouth once a week.     No current facility-administered medications for this visit.    Family History  Problem Relation Age of Onset  . Arthritis Mother   . Osteoarthritis Mother   .  Arthritis Paternal Grandmother     Review of Systems  Neurological: Negative for weakness.  All other systems reviewed and are negative.   Exam:   BP 128/62   Pulse 69   Resp 16   Ht 5\' 6"  (1.676 m)   Wt 145 lb (65.8 kg)   SpO2 97%   BMI 23.40 kg/m     General appearance: alert, cooperative and appears stated age Head: normocephalic, without obvious abnormality, atraumatic Neck: no adenopathy, supple, symmetrical, trachea midline and thyroid normal to inspection and palpation Lungs: clear to auscultation bilaterally Breasts: normal appearance, no masses or tenderness, No nipple retraction or dimpling, No nipple discharge or bleeding, No axillary adenopathy Heart: regular rate and rhythm Abdomen: soft, non-tender; no masses, no organomegaly Extremities: extremities normal, atraumatic, no cyanosis or edema Skin: skin color, texture, turgor normal. No rashes or lesions Lymph nodes: cervical, supraclavicular, and axillary nodes normal. Neurologic: grossly normal  Pelvic: External genitalia:  no lesions              No abnormal inguinal nodes palpated.              Urethra:  normal appearing urethra  with no masses, tenderness or lesions              Bartholins and Skenes: normal                 Vagina: normal appearing vagina with normal color and discharge, no lesions              Cervix: no lesions. IUD strings seen.              Pap taken: No. Bimanual Exam:  Uterus:  normal size, contour, position, consistency, mobility, non-tender              Adnexa: no mass, fullness, tenderness              Rectal exam: Yes.  .  Confirms.              Anus:  normal sphincter tone, no lesions  Chaperone was present for exam.  Assessment:   Well woman visit with normal exam. Mirena IUD patient. Low vit D. Hx frequent yeast infections. Umbilical hernia.  Plan: Mammogram screening discussed. Self breast awareness reviewed. Pap and HR HPV 2024.  Guidelines for Calcium, Vitamin  D, regular exercise program including cardiovascular and weight bearing exercise. TDap.  Flu vaccine.  Protiobic Rx to [facility].  Follow up annually and prn.   After visit summary provided.

## 2020-07-27 NOTE — Patient Instructions (Signed)

## 2020-07-30 MED FILL — LACTINEX CHEWABLE TABLET: 10 days supply | Qty: 150 | Fill #0

## 2020-08-22 ENCOUNTER — Ambulatory Visit: Payer: BC Managed Care – PPO | Admitting: Obstetrics and Gynecology

## 2020-08-27 ENCOUNTER — Other Ambulatory Visit: Payer: Self-pay

## 2020-08-27 ENCOUNTER — Ambulatory Visit (HOSPITAL_BASED_OUTPATIENT_CLINIC_OR_DEPARTMENT_OTHER)
Admission: RE | Admit: 2020-08-27 | Discharge: 2020-08-27 | Disposition: A | Discharge status: Home / Self Care | Payer: BC Managed Care – PPO | Source: Ambulatory Visit | Attending: Internal Medicine | Admitting: Internal Medicine

## 2020-08-27 DIAGNOSIS — Z1231 Encounter for screening mammogram for malignant neoplasm of breast: Secondary | ICD-10-CM | POA: Diagnosis not present

## 2020-08-30 ENCOUNTER — Other Ambulatory Visit: Payer: Self-pay | Admitting: Internal Medicine

## 2020-08-30 DIAGNOSIS — R928 Other abnormal and inconclusive findings on diagnostic imaging of breast: Secondary | ICD-10-CM

## 2020-09-12 ENCOUNTER — Ambulatory Visit
Admission: RE | Admit: 2020-09-12 | Discharge: 2020-09-12 | Disposition: A | Discharge status: Home / Self Care | Payer: BC Managed Care – PPO | Source: Ambulatory Visit | Attending: Internal Medicine | Admitting: Internal Medicine

## 2020-09-12 ENCOUNTER — Other Ambulatory Visit: Payer: Self-pay

## 2020-09-12 DIAGNOSIS — R928 Other abnormal and inconclusive findings on diagnostic imaging of breast: Secondary | ICD-10-CM

## 2020-09-12 DIAGNOSIS — N6012 Diffuse cystic mastopathy of left breast: Secondary | ICD-10-CM | POA: Diagnosis not present

## 2020-09-12 DIAGNOSIS — R922 Inconclusive mammogram: Secondary | ICD-10-CM | POA: Diagnosis not present

## 2020-09-18 ENCOUNTER — Other Ambulatory Visit (HOSPITAL_BASED_OUTPATIENT_CLINIC_OR_DEPARTMENT_OTHER): Payer: Self-pay | Admitting: Internal Medicine

## 2020-09-18 MED FILL — FLUARIX QUADRIVALENT 0.5 ML: 0.5 | 1 days supply | Qty: 1 | Fill #0

## 2020-12-14 IMAGING — MG DIGITAL SCREENING BILAT W/ TOMO W/ CAD
8 series · 9 of 24 positions shown · non-contrast
Comparison: Previous exam(s).

CLINICAL DATA: Screening.

EXAM:
DIGITAL SCREENING BILATERAL MAMMOGRAM WITH TOMO AND CAD

[L MLO synth-2D]
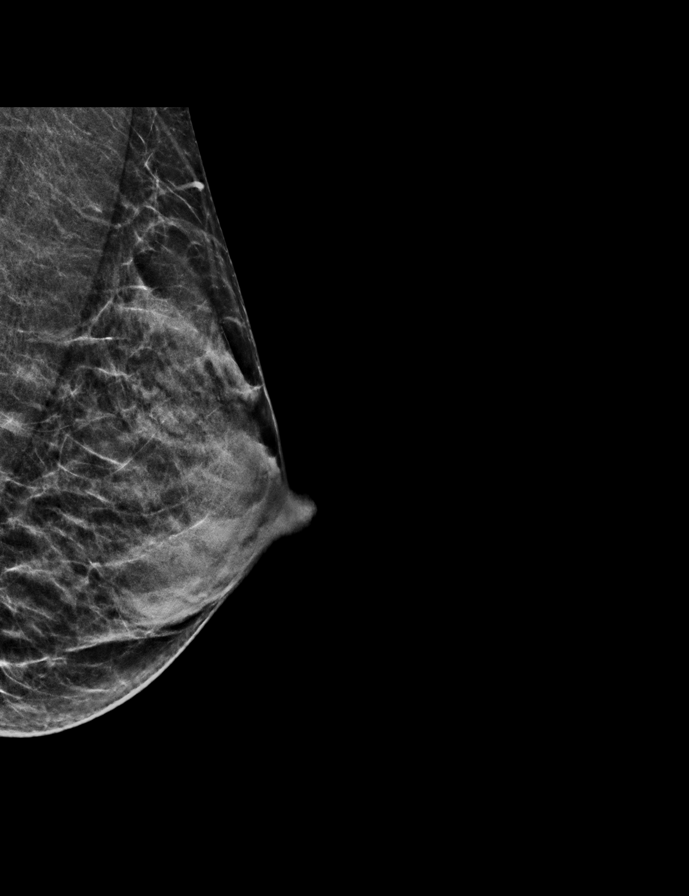

[R MLO synth-2D]
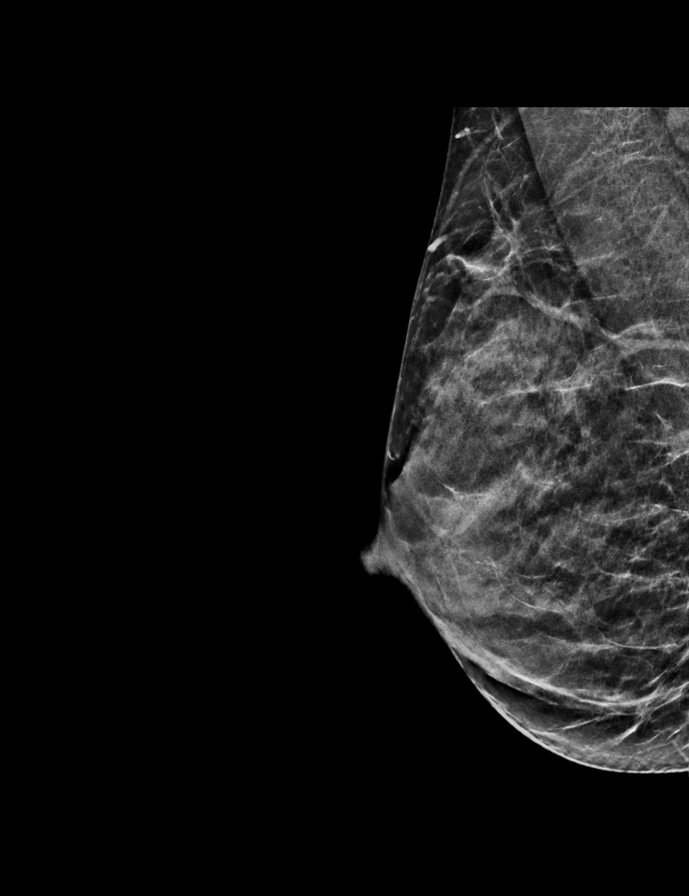

[R CC synth-2D]
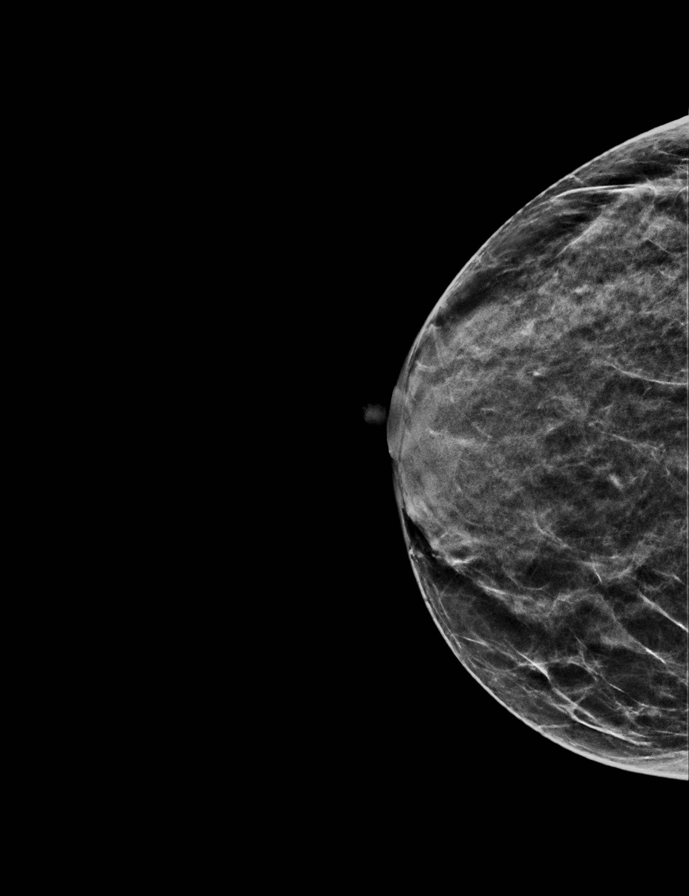

[L CC synth-2D]
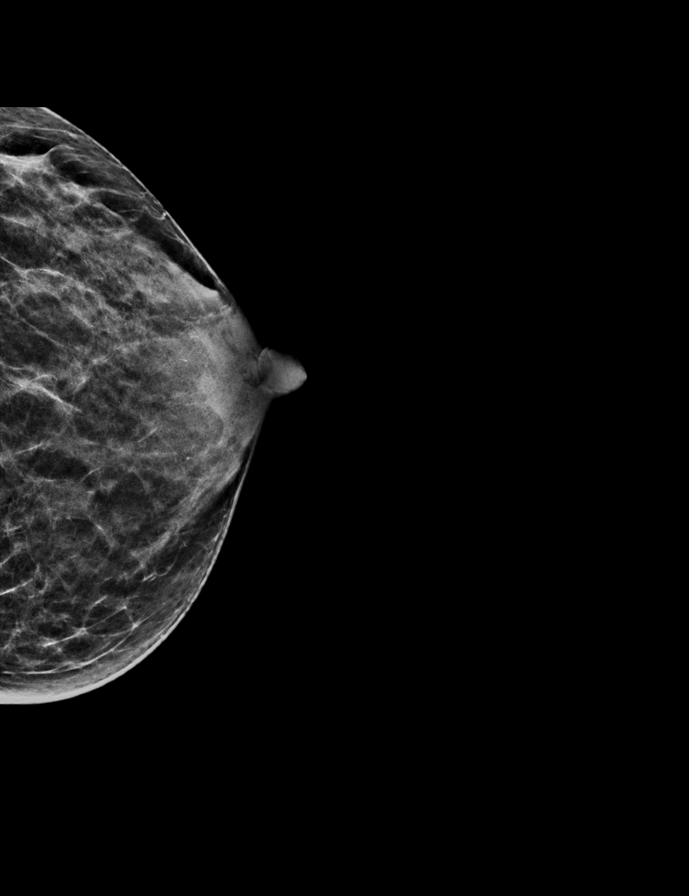

[L MLO tomo · 2 of 45 frames shown]
[frame 15/45]
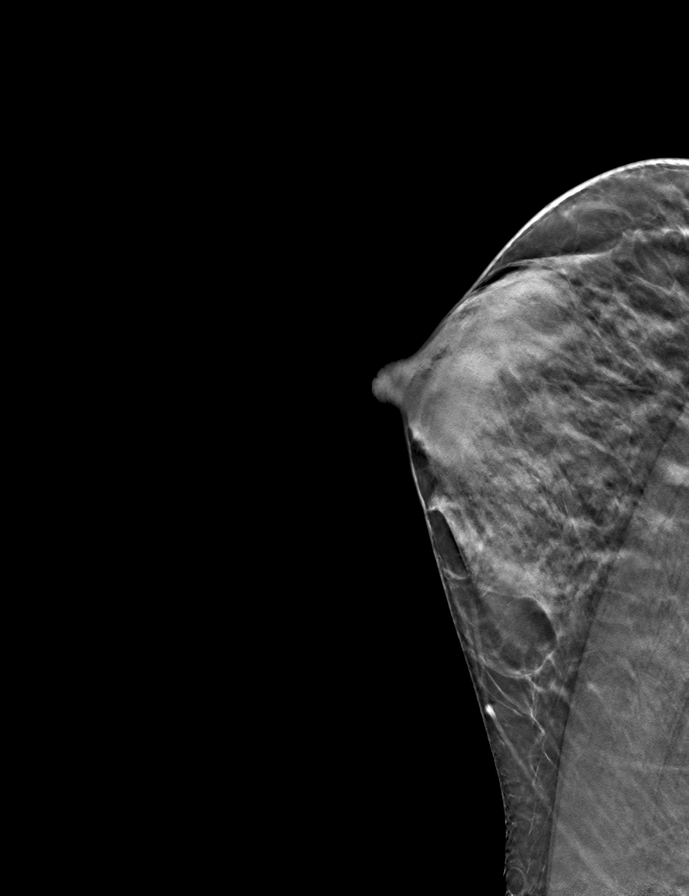
[frame 23/45]
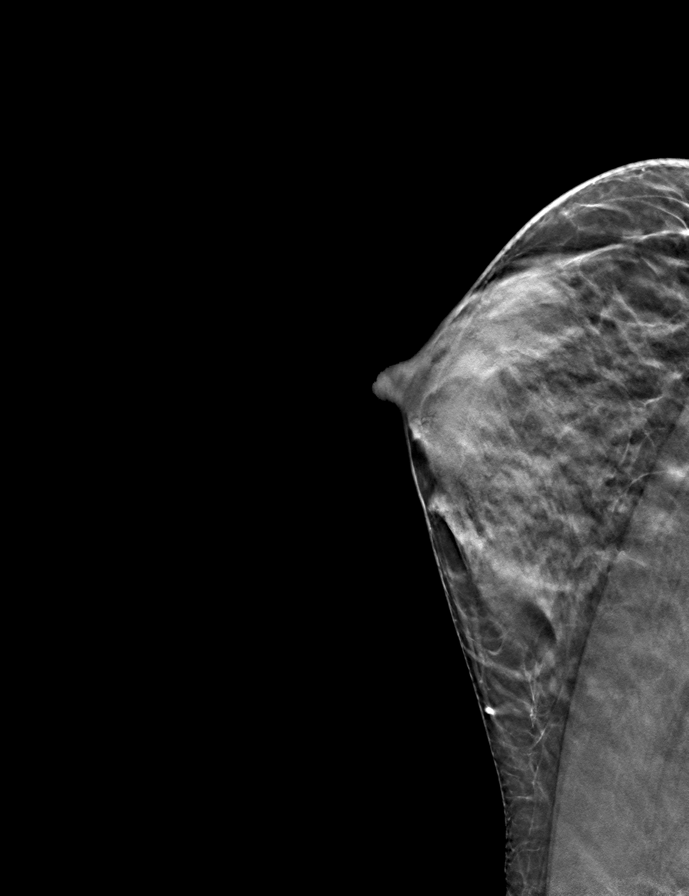

[R MLO tomo · tomo slice 24/47.0]
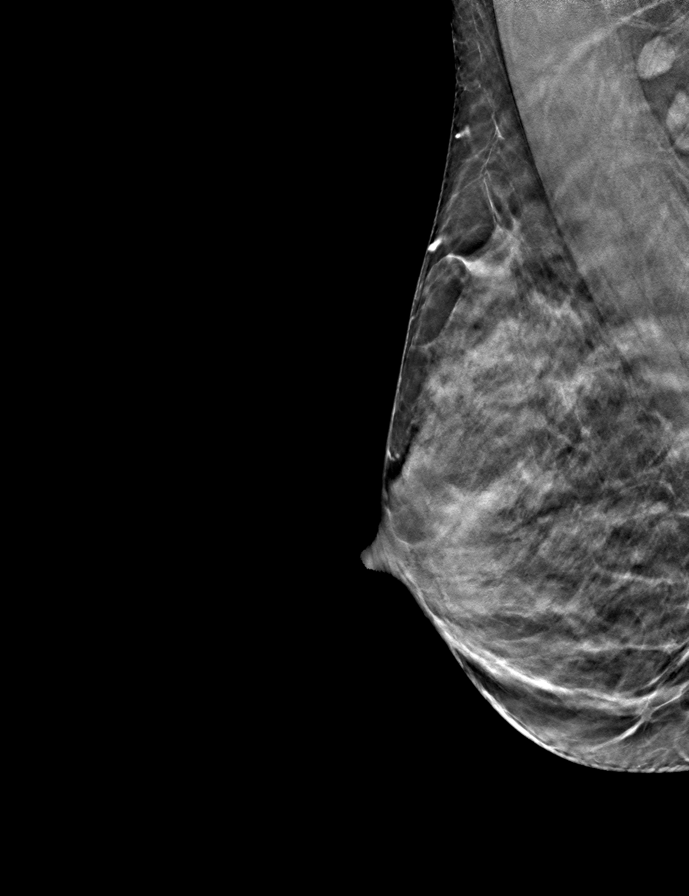

[L CC tomo · tomo slice 21/42.0]
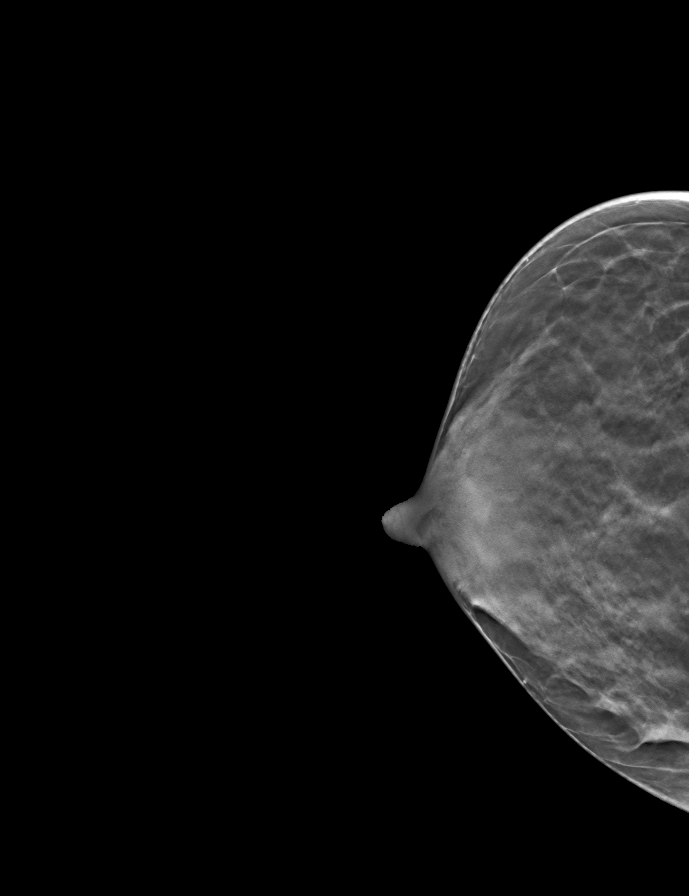

[R CC tomo · tomo slice 23/45.0]
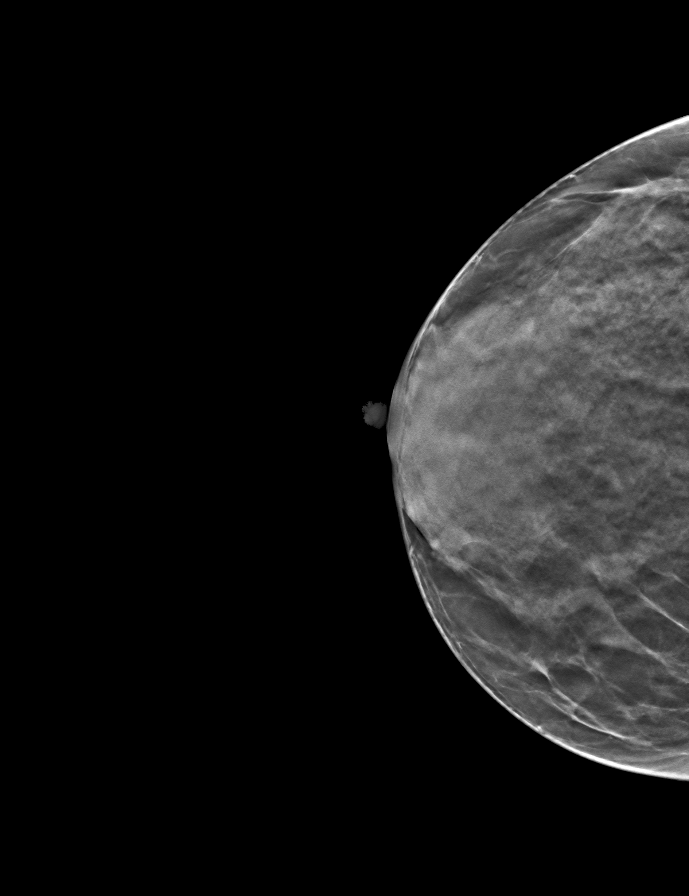

[9 of 24 positions shown; findings below may reference images not displayed]

ACR Breast Density Category d: The breast tissue is extremely dense,
which lowers the sensitivity of mammography
FINDINGS: There are no findings suspicious for malignancy. Images were
processed with CAD.
IMPRESSION: No mammographic evidence of malignancy. A result letter of this
screening mammogram will be mailed directly to the patient.

RECOMMENDATION:
Screening mammogram in one year. (Code:WO-0-ZI0)

BI-RADS CATEGORY  1: Negative.

## 2021-02-13 DIAGNOSIS — Z20822 Contact with and (suspected) exposure to covid-19: Secondary | ICD-10-CM | POA: Diagnosis not present

## 2021-02-13 DIAGNOSIS — U071 COVID-19: Secondary | ICD-10-CM | POA: Diagnosis not present

## 2021-03-15 ENCOUNTER — Other Ambulatory Visit (HOSPITAL_BASED_OUTPATIENT_CLINIC_OR_DEPARTMENT_OTHER): Payer: Self-pay

## 2021-03-15 MED FILL — Lactobacillus Chew Tab: ORAL | 25 days supply | Qty: 200 | Fill #0 | Status: CN

## 2021-03-18 ENCOUNTER — Other Ambulatory Visit (HOSPITAL_BASED_OUTPATIENT_CLINIC_OR_DEPARTMENT_OTHER): Payer: Self-pay

## 2021-03-25 ENCOUNTER — Other Ambulatory Visit (HOSPITAL_BASED_OUTPATIENT_CLINIC_OR_DEPARTMENT_OTHER): Payer: Self-pay

## 2021-03-25 MED FILL — Lactobacillus Chew Tab: ORAL | 13 days supply | Qty: 200 | Fill #0 | Status: AC

## 2021-04-02 ENCOUNTER — Other Ambulatory Visit (HOSPITAL_BASED_OUTPATIENT_CLINIC_OR_DEPARTMENT_OTHER): Payer: Self-pay

## 2021-05-03 DIAGNOSIS — E559 Vitamin D deficiency, unspecified: Secondary | ICD-10-CM | POA: Diagnosis not present

## 2021-05-03 DIAGNOSIS — Z13220 Encounter for screening for lipoid disorders: Secondary | ICD-10-CM | POA: Diagnosis not present

## 2021-05-03 DIAGNOSIS — Z0000 Encounter for general adult medical examination without abnormal findings: Secondary | ICD-10-CM | POA: Diagnosis not present

## 2021-05-06 ENCOUNTER — Other Ambulatory Visit (HOSPITAL_BASED_OUTPATIENT_CLINIC_OR_DEPARTMENT_OTHER): Payer: Self-pay

## 2021-05-06 MED ORDER — ERGOCALCIFEROL 1.25 MG (50000 UT) PO CAPS
ORAL_CAPSULE | ORAL | 2 refills | Status: DC
Start: 2021-05-06 — End: 2021-11-08
  Filled 2021-05-06: qty 4, 28d supply, fill #0
  Filled 2021-06-13: qty 4, 28d supply, fill #1
  Filled 2021-08-12: qty 4, 28d supply, fill #2

## 2021-06-14 ENCOUNTER — Other Ambulatory Visit (HOSPITAL_BASED_OUTPATIENT_CLINIC_OR_DEPARTMENT_OTHER): Payer: Self-pay

## 2021-08-05 ENCOUNTER — Ambulatory Visit: Payer: BC Managed Care – PPO | Admitting: Obstetrics and Gynecology

## 2021-08-12 ENCOUNTER — Other Ambulatory Visit (HOSPITAL_BASED_OUTPATIENT_CLINIC_OR_DEPARTMENT_OTHER): Payer: Self-pay

## 2021-10-09 ENCOUNTER — Encounter: Payer: Self-pay | Admitting: Obstetrics and Gynecology

## 2021-10-09 DIAGNOSIS — Z1231 Encounter for screening mammogram for malignant neoplasm of breast: Secondary | ICD-10-CM

## 2021-10-09 NOTE — Telephone Encounter (Signed)
Annual exam scheduled on 11/08/21

## 2021-10-18 ENCOUNTER — Ambulatory Visit (HOSPITAL_BASED_OUTPATIENT_CLINIC_OR_DEPARTMENT_OTHER)
Admission: RE | Admit: 2021-10-18 | Discharge: 2021-10-18 | Disposition: A | Discharge status: Home / Self Care | Payer: BC Managed Care – PPO | Source: Ambulatory Visit | Attending: Obstetrics and Gynecology | Admitting: Obstetrics and Gynecology

## 2021-10-18 ENCOUNTER — Encounter (HOSPITAL_BASED_OUTPATIENT_CLINIC_OR_DEPARTMENT_OTHER): Payer: BC Managed Care – PPO

## 2021-10-18 ENCOUNTER — Encounter (HOSPITAL_BASED_OUTPATIENT_CLINIC_OR_DEPARTMENT_OTHER): Payer: Self-pay

## 2021-10-18 ENCOUNTER — Other Ambulatory Visit: Payer: Self-pay

## 2021-10-18 DIAGNOSIS — Z1231 Encounter for screening mammogram for malignant neoplasm of breast: Secondary | ICD-10-CM | POA: Insufficient documentation

## 2021-10-29 NOTE — Progress Notes (Signed)
51 y.o. G66P0012 Married Caucasian female here for annual exam.    Feels hot at night.   Hx vit D deficiency.   Son graduating.  Working in [place].   PCP:   [facility]  No LMP recorded. (Menstrual status: IUD).           Sexually active: Yes.    The current method of family planning is IUD.   Mirena inserted 02/14/20. Exercising: No.  Home exercise routine includes walking .5 hrs per week. Smoker:  no  Health Maintenance: Pap:  09-22-18 normal neg HR HPV History of abnormal Pap:  no MMG:  10-18-21 normal, BIRADS- 1, cat C density Colonoscopy:  never.  Has a [facility] for [facility] in [place] [facility].  BMD:   Never  Result  never TDaP:  2021 Gardasil:  no HIV:  neg in pregnancy Hep C:  will donate blood today. Screening Labs:  PCP.  Flu vaccine:  completed.  Covid booster bivalent:  completed.    reports that she has never smoked. She has never used smokeless tobacco. She reports current alcohol use of about 1.0 - 2.0 standard drink per week. She reports that she does not use drugs.  Past Medical History:  Diagnosis Date   COVID-19 09/2019   IUD (intrauterine device) in place    Vitamin D deficiency     Past Surgical History:  Procedure Laterality Date   DILATION AND CURETTAGE OF UTERUS  2003   MAB   mirena      Current Outpatient Medications  Medication Sig Dispense Refill   NONFORMULARY OR COMPOUNDED ITEM Lactinex probiotics.  Take 4 tablets 4 times a day. 4 each 7   levonorgestrel (MIRENA) 20 MCG/24HR IUD 1 each by Intrauterine route once.     No current facility-administered medications for this visit.    Family History  Problem Relation Age of Onset   Arthritis Mother    Osteoarthritis Mother    Arthritis Paternal Grandmother     Review of Systems  All other systems reviewed and are negative.  Exam:   BP 116/78    Pulse 78    Resp 18    Ht 5\' 6"  (1.676 m)    Wt 155 lb (70.3 kg)    BMI 25.02 kg/m     General appearance: alert,  cooperative and appears stated age Head: normocephalic, without obvious abnormality, atraumatic Neck: no adenopathy, supple, symmetrical, trachea midline and thyroid normal to inspection and palpation Lungs: clear to auscultation bilaterally Breasts: normal appearance, no masses or tenderness, No nipple retraction or dimpling, No nipple discharge or bleeding, No axillary adenopathy Heart: regular rate and rhythm Abdomen: soft, non-tender; no masses, no organomegaly Extremities: extremities normal, atraumatic, no cyanosis or edema Skin: skin color, texture, turgor normal. No rashes or lesions Lymph nodes: cervical, supraclavicular, and axillary nodes normal. Neurologic: grossly normal  Pelvic: External genitalia:  no lesions              No abnormal inguinal nodes palpated.              Urethra:  normal appearing urethra with no masses, tenderness or lesions              Bartholins and Skenes: normal                 Vagina: normal appearing vagina with normal color and discharge, no lesions              Cervix: no  lesions.  IUD strings noted.               Pap taken: no Bimanual Exam:  Uterus:  normal size, contour, position, consistency, mobility, non-tender              Adnexa: no mass, fullness, tenderness              Rectal exam: yes.  Confirms.              Anus:  normal sphincter tone, no lesions  Chaperone was present for exam: Zitlali, CMA  Assessment:   Well woman visit with gynecologic exam. Mirena IUD patient. Low vit D.   Plan: Mammogram screening discussed. Self breast awareness reviewed. Pap and HR HPV 2024. Guidelines for Calcium, Vitamin D, regular exercise program including cardiovascular and weight bearing exercise. Check Vit D level.  Refill of probiotics.  Follow up annually and prn.   After visit summary provided.

## 2021-11-08 ENCOUNTER — Encounter: Payer: Self-pay | Admitting: Obstetrics and Gynecology

## 2021-11-08 ENCOUNTER — Ambulatory Visit (INDEPENDENT_AMBULATORY_CARE_PROVIDER_SITE_OTHER): Payer: BC Managed Care – PPO | Admitting: Obstetrics and Gynecology

## 2021-11-08 ENCOUNTER — Other Ambulatory Visit: Payer: Self-pay

## 2021-11-08 VITALS — BP 116/78 | HR 78 | Resp 18 | Ht 66.0 in | Wt 155.0 lb

## 2021-11-08 DIAGNOSIS — Z01419 Encounter for gynecological examination (general) (routine) without abnormal findings: Secondary | ICD-10-CM

## 2021-11-08 DIAGNOSIS — E559 Vitamin D deficiency, unspecified: Secondary | ICD-10-CM

## 2021-11-08 MED ORDER — NONFORMULARY OR COMPOUNDED ITEM: 7 refills | Status: DC

## 2021-11-08 NOTE — Patient Instructions (Signed)

## 2021-11-09 LAB — VITAMIN D 25 HYDROXY (VIT D DEFICIENCY, FRACTURES): Vit D, 25-Hydroxy: 31 ng/mL (ref 30–100)

## 2022-02-17 ENCOUNTER — Other Ambulatory Visit (HOSPITAL_BASED_OUTPATIENT_CLINIC_OR_DEPARTMENT_OTHER): Payer: Self-pay

## 2022-02-17 ENCOUNTER — Encounter: Payer: Self-pay | Admitting: Obstetrics and Gynecology

## 2022-02-17 ENCOUNTER — Ambulatory Visit (INDEPENDENT_AMBULATORY_CARE_PROVIDER_SITE_OTHER): Payer: BC Managed Care – PPO | Admitting: Obstetrics and Gynecology

## 2022-02-17 VITALS — BP 110/70 | Ht 66.0 in | Wt 155.0 lb

## 2022-02-17 DIAGNOSIS — N951 Menopausal and female climacteric states: Secondary | ICD-10-CM

## 2022-02-17 DIAGNOSIS — R4586 Emotional lability: Secondary | ICD-10-CM

## 2022-02-17 MED ORDER — BUPROPION HCL ER (XL) 150 MG PO TB24
150.0000 mg | ORAL_TABLET | Freq: Every day | ORAL | 1 refills | Status: DC
  Filled 2022-02-17: qty 30, 30d supply, fill #0
  Filled 2022-03-12: qty 30, 30d supply, fill #1

## 2022-02-17 MED ORDER — ESTRADIOL 0.05 MG/24HR TD PTTW
1.0000 | MEDICATED_PATCH | TRANSDERMAL | 1 refills | Status: DC
  Filled 2022-02-17: qty 8, 28d supply, fill #0
  Filled 2022-03-11: qty 8, 28d supply, fill #1

## 2022-02-17 NOTE — Progress Notes (Signed)
GYNECOLOGY  VISIT ?  ?HPI: ?51 y.o.   Married  Caucasian  female   ?[id number] with No LMP recorded. (Menstrual status: IUD).   ?here for major hot flashes and mood swings. ?Symptoms present since March and can fluctuate in intensity. ? ?Some night sweats, which are disruptive.  ?Feels sweatiness at night for the last year. ? ?Has decreased energy.  ?Does feel blue in the winter months.  ?Not anxious, but does feel more down.  ?Used Wellbutrin in the past and did well with this.  ? ?No bleeding with Mirena.  ? ?GYNECOLOGIC HISTORY: ?No LMP recorded. (Menstrual status: IUD). ?Contraception:  Mirena IUD 02-14-20 ?Menopausal hormone therapy:  none ?Last mammogram:  10-18-21 Neg/BiRads1 ?Last pap smear:  09-22-18 Neg:Neg HR HPV, 10-03-16 Neg:Neg HR HPV, 10-18-12 Neg:Neg HR HPV ?       ?OB History   ? ? Gravida  ?3  ? Para  ?   ? Term  ?   ? Preterm  ?   ? AB  ?1  ? Living  ?2  ?  ? ? SAB  ?1  ? IAB  ?   ? Ectopic  ?   ? Multiple  ?   ? Live Births  ?   ?   ?  ?  ?    ? ?Patient Active Problem List  ? Diagnosis Date Noted  ? Upper back pain 12/12/2014  ? Vitamin D deficiency 12/07/2013  ? Adjustment disorder with mixed anxiety and depressed mood 04/12/2013  ? Seasonal affective disorder (HCC) 10/18/2012  ? Abdominal hernia 10/18/2012  ? H/O vitamin D deficiency 07/15/2012  ? Muscle spasm 07/15/2012  ? Tinea 07/15/2012  ? ? ?Past Medical History:  ?Diagnosis Date  ? COVID-19 09/2019  ? IUD (intrauterine device) in place   ? Vitamin D deficiency   ? ? ?Past Surgical History:  ?Procedure Laterality Date  ? DILATION AND CURETTAGE OF UTERUS  2003  ? MAB  ? mirena    ? ? ?Current Outpatient Medications  ?Medication Sig Dispense Refill  ? levonorgestrel (MIRENA) 20 MCG/24HR IUD 1 each by Intrauterine route once.    ? NONFORMULARY OR COMPOUNDED ITEM Lactinex probiotics.  Take 4 tablets 4 times a day. 4 each 7  ? ?No current facility-administered medications for this visit.  ?  ? ?ALLERGIES: Patient has no known allergies. ? ?Family  History  ?Problem Relation Age of Onset  ? Arthritis Mother   ? Osteoarthritis Mother   ? Arthritis Paternal Grandmother   ? ? ?Social History  ? ?Socioeconomic History  ? Marital status: Married  ?  Spouse name: Not on file  ? Number of children: Not on file  ? Years of education: Not on file  ? Highest education level: Not on file  ?Occupational History  ? Not on file  ?Tobacco Use  ? Smoking status: Never  ? Smokeless tobacco: Never  ?Vaping Use  ? Vaping Use: Never used  ?Substance and Sexual Activity  ? Alcohol use: Yes  ?  Alcohol/week: 1.0 - 2.0 standard drink  ?  Types: 1 - 2 Standard drinks or equivalent per week  ? Drug use: No  ? Sexual activity: Yes  ?  Partners: Male  ?  Birth control/protection: I.U.D.  ?  Comment: Mirena inserted 02-12-15  ?Other Topics Concern  ? Not on file  ?Social History Narrative  ? Not on file  ? ?Social Determinants of Health  ? ?Financial Resource Strain: Not on file  ?  Food Insecurity: Not on file  ?Transportation Needs: Not on file  ?Physical Activity: Not on file  ?Stress: Not on file  ?Social Connections: Not on file  ?Intimate Partner Violence: Not on file  ? ? ?Review of Systems  ?Genitourinary:   ?     Menopausal symptoms--Hot flashes, mood swings  ?All other systems reviewed and are negative. ? ?PHYSICAL EXAMINATION:   ? ?BP 110/70   Ht 5\' 6"  (1.676 m)   Wt 155 lb (70.3 kg)   BMI 25.02 kg/m?     ?General appearance: alert, cooperative and appears stated age ? ?ASSESSMENT ? ?Menopausal symptoms.  ?Mood swings.  ?Mirena IUD.  ? ?PLAN ? ?We discussed options for menopausal symptoms:  HRT, Paxil, Effexor, Gabapentin, herbal options for treating vasomotor symptoms.  Risks and benefits of each reviewed.  ?HRT using Mirena IUD and estrogen discussed in detail.  Risks of HRT - stroke, DVT, PE, MI, and breast cancer reviewed.  ?Patient will start Vivelle Dot 0.05 mg twice weekly.  #8, RF one.  ?We also discussed medication to treat mood swings and feeling blue.   ?Patient will  start Wellbutrin XL 150 mg daily.  #30, RF one.  ?FU for a recheck in 6 weeks.  ?  ?An After Visit Summary was printed and given to the patient. ? ?30 min  total time was spent for this patient encounter, including preparation, face-to-face counseling with the patient, coordination of care, and documentation of the encounter. ? ?

## 2022-02-17 NOTE — Patient Instructions (Signed)
Estradiol Skin Patches ?What is this medication? ?ESTRADIOL (es tra DYE ole) reduces the number and severity of hot flashes due to menopause. It may also help relieve the symptoms of menopause, such as vaginal irritation, dryness, or pain during sex. It can also be used to prevent osteoporosis after menopause. It works by increasing levels of the hormone estrogen in the body. This medication is an estrogen hormone. ?This medicine may be used for other purposes; ask your health care provider or pharmacist if you have questions. ?COMMON BRAND NAME(S): Alora, Climara, DOTTI, Esclim, Estraderm, FemPatch, LYLLANA, Menostar, Minivelle, Vivelle, Vivelle-Dot ?What should I tell my care team before I take this medication? ?They need to know if you have any of these conditions: ?Abnormal vaginal bleeding ?Blood vessel disease or blood clots ?Breast, cervical, endometrial, ovarian, liver, or uterine cancer ?Dementia ?Diabetes (high blood sugar) ?Gallbladder disease ?Heart disease or recent heart attack ?High blood pressure ?High cholesterol ?High levels of calcium in the blood ?Hysterectomy ?Kidney disease ?Liver disease ?Low thyroid levels ?Lupus ?Migraine headaches ?Protein C/S deficiency ?Smoke tobacco cigarettes ?Stroke ?An unusual or allergic reaction to estrogens, other medications, foods, dyes, or preservatives ?Pregnant or trying to get pregnant ?Breast-feeding ?How should I use this medication? ?This medication is for external use only. Use it as directed on the prescription label. Apply the patch, sticky side to the skin, to an area that is clean, dry and hairless. Do not cut or trim the patch. Do not wear more than 1 patch at a time. Remove the old patch before using a new patch. Use a different site each time to prevent skin irritation. Keep using it unless your care team tells you to stop. ?This medication comes with INSTRUCTIONS FOR USE. Ask your pharmacist for directions on how to use this medication. Read the  information carefully. Talk to your pharmacist or care team if you have questions. ?A patient package insert for the product will be given with each prescription and refill. Be sure to read this information carefully each time. ?Talk to your care team about the use of this medication in children. Special care may be needed. ?Overdosage: If you think you have taken too much of this medicine contact a poison control center or emergency room at once. ?NOTE: This medicine is only for you. Do not share this medicine with others. ?What if I miss a dose? ?If you miss a dose, apply it as soon as you can. If it is almost time for your next dose, apply only that dose. Do not apply double or extra doses. ?What may interact with this medication? ?Do not take this medication with any of the following: ?Aromatase inhibitors like aminoglutethimide, anastrozole, exemestane, letrozole, testolactone ?This medication may also interact with the following: ?Carbamazepine ?Certain antibiotics like erythromycin or clarithromycin ?Certain antiviral medications for HIV or hepatitis ?Certain medications for fungal infections like ketoconazole, itraconazole, or posaconazole ?Medications for fungus infections like itraconazole and ketoconazole ?Phenobarbital ?Raloxifene ?Rifampin ?St. John's Wort ?Tamoxifen ?This list may not describe all possible interactions. Give your health care provider a list of all the medicines, herbs, non-prescription drugs, or dietary supplements you use. Also tell them if you smoke, drink alcohol, or use illegal drugs. Some items may interact with your medicine. ?What should I watch for while using this medication? ?Visit your care team for regular checks on your progress. You will need a regular breast and pelvic exam and Pap smear while on this medication. You should also discuss the need for regular  mammograms with your care team, and follow his or her guidelines for these tests. ?This medication can make your  body retain fluid, making your fingers, hands, or ankles swell. Your blood pressure can go up. Contact your care team if you feel you are retaining fluid. ?If you have any reason to think you are pregnant, stop taking this medication right away and contact your care team. ?Smoking increases the risk of getting a blood clot or having a stroke while you are taking this medication, especially if you are more than 50 years old. You are strongly advised not to smoke. ?If you wear contact lenses and notice visual changes, or if the lenses begin to feel uncomfortable, consult your eye care specialist. ?This medication can increase the risk of developing a condition (endometrial hyperplasia) that may lead to cancer of the lining of the uterus. Taking progestins, another hormone medication, with this medication lowers the risk of developing this condition. Therefore, if your uterus has not been removed (by a hysterectomy), your care team may prescribe a progestin for you to take together with your estrogen. You should know, however, that taking estrogens with progestins may have additional health risks. You should discuss the use of estrogens and progestins with your care team to determine the benefits and risks for you. ?If you are going to need surgery, an MRI, CT scan, or other procedure, tell your care team that you are using this medication. You may need to remove the patch before the procedure. ?Contact with water while you are swimming, using a sauna, bathing, or showering may cause the patch to fall off. If your patch falls off reapply it. If you cannot reapply the patch, apply a new patch to another area and continue to follow your usual dose schedule. ?What side effects may I notice from receiving this medication? ?Side effects that you should report to your care team as soon as possible: ?Allergic reactions--skin rash, itching, hives, swelling of the face, lips, tongue, or throat ?Blood clot--pain, swelling, or  warmth in the leg, shortness of breath, chest pain ?Breast tissue changes, new lumps, redness, pain, or discharge from the nipple ?Gallbladder problems--severe stomach pain, nausea, vomiting, fever ?Increase in blood pressure ?Liver injury--right upper belly pain, loss of appetite, nausea, light-colored stool, dark yellow or brown urine, yellowing skin or eyes, unusual weakness or fatigue ?Stroke--sudden numbness or weakness of the face, arm, or leg, trouble speaking, confusion, trouble walking, loss of balance or coordination, dizziness, severe headache, change in vision ?Unusual vaginal discharge, itching, or odor ?Vaginal bleeding after menopause, pelvic pain ?Side effects that usually do not require medical attention (report to your care team if they continue or are bothersome): ?Bloating ?Breast pain or tenderness ?Hair loss ?Nausea ?Stomach pain ?Vomiting ?This list may not describe all possible side effects. Call your doctor for medical advice about side effects. You may report side effects to FDA at 1-800-FDA-1088. ?Where should I keep my medication? ?Keep out of the reach of children and pets. ?Store at room temperature between 20 and 25 degrees C (68 and 77 degrees F). Keep this medication in the original pouch until you are ready to use it. Get rid of any unused medication after the expiration date. ?Get rid of used patches properly. Since used patches may still contain active medication, fold the patch in half so that it sticks to itself before throwing it away. Put it in the trash where children and pets cannot reach it. ?It is important to get  rid of the medication as soon as you no longer need it, or it is expired. You can do this in two ways: ?Take the medication to a medication take-back program. Check with your pharmacy or law enforcement to find a location. ?If you cannot return the medication, ask your pharmacist or care team how to get rid of this medication safely. ?NOTE: This sheet is a  summary. It may not cover all possible information. If you have questions about this medicine, talk to your doctor, pharmacist, or health care provider. ?? 2023 Elsevier/Gold Standard (2020-10-26 00:00:00) ? ? ?

## 2022-03-11 ENCOUNTER — Other Ambulatory Visit (HOSPITAL_BASED_OUTPATIENT_CLINIC_OR_DEPARTMENT_OTHER): Payer: Self-pay

## 2022-03-12 ENCOUNTER — Other Ambulatory Visit (HOSPITAL_BASED_OUTPATIENT_CLINIC_OR_DEPARTMENT_OTHER): Payer: Self-pay

## 2022-03-12 MED ORDER — LACTINEX PO CHEW
CHEWABLE_TABLET | ORAL | 7 refills | Status: DC
  Filled 2022-03-12: qty 200, 13d supply, fill #0

## 2022-03-13 ENCOUNTER — Other Ambulatory Visit (HOSPITAL_BASED_OUTPATIENT_CLINIC_OR_DEPARTMENT_OTHER): Payer: Self-pay

## 2022-03-14 DIAGNOSIS — Z13220 Encounter for screening for lipoid disorders: Secondary | ICD-10-CM | POA: Diagnosis not present

## 2022-03-14 DIAGNOSIS — Z0000 Encounter for general adult medical examination without abnormal findings: Secondary | ICD-10-CM | POA: Diagnosis not present

## 2022-03-14 DIAGNOSIS — E538 Deficiency of other specified B group vitamins: Secondary | ICD-10-CM | POA: Diagnosis not present

## 2022-03-14 DIAGNOSIS — E559 Vitamin D deficiency, unspecified: Secondary | ICD-10-CM | POA: Diagnosis not present

## 2022-04-01 NOTE — Progress Notes (Signed)
GYNECOLOGY  VISIT   HPI: 51 y.o.   Married  Caucasian  female   [phone] with No LMP recorded. (Menstrual status: IUD).   here for medication follow up. Doing much better on Vivelle Dot and Wellbutrin.  Started estradiol for hot flashes.  Hot flashes are improved.   Sleeping better.   Also was experiencing mood swings and decreased energy.  Wellbutrin is helping her energy but motivation is still not where she wants it to be.   Going on vacation and trying to accomplish a lot before going away.   GYNECOLOGIC HISTORY: No LMP recorded. (Menstrual status: IUD). Contraception:  Mirena IUD 02-14-20 Menopausal hormone therapy:  Vivelle Dot 0.05mg  Last mammogram:  10-18-21 Neg/BiRads1 Last pap smear: 09-22-18 Neg:Neg HR HPV, 10-03-16 Neg:Neg HR HPV, 10-18-12 Neg:Neg HR HPV        OB History     Gravida  3   Para      Term      Preterm      AB  1   Living  2      SAB  1   IAB      Ectopic      Multiple      Live Births                 Patient Active Problem List   Diagnosis Date Noted   Upper back pain 12/12/2014   Vitamin D deficiency 12/07/2013   Adjustment disorder with mixed anxiety and depressed mood 04/12/2013   Seasonal affective disorder (HCC) 10/18/2012   Abdominal hernia 10/18/2012   H/O vitamin D deficiency 07/15/2012   Muscle spasm 07/15/2012   Tinea 07/15/2012    Past Medical History:  Diagnosis Date   COVID-19 09/2019   IUD (intrauterine device) in place    Vitamin D deficiency     Past Surgical History:  Procedure Laterality Date   DILATION AND CURETTAGE OF UTERUS  2003   MAB   mirena      Current Outpatient Medications  Medication Sig Dispense Refill   buPROPion (WELLBUTRIN XL) 150 MG 24 hr tablet Take 1 tablet (150 mg total) by mouth daily. 30 tablet 1   estradiol (VIVELLE-DOT) 0.05 MG/24HR patch Place 1 patch (0.05 mg total) onto the skin 2 (two) times a week. 8 patch 1   lactobacillus acidophilus & bulgar (LACTINEX) chewable  tablet Chew 4 tablets by mouth 4 times a day 200 tablet 7   levonorgestrel (MIRENA) 20 MCG/24HR IUD 1 each by Intrauterine route once.     NONFORMULARY OR COMPOUNDED ITEM Lactinex probiotics.  Take 4 tablets 4 times a day. 4 each 7   No current facility-administered medications for this visit.     ALLERGIES: Patient has no known allergies.  Family History  Problem Relation Age of Onset   Arthritis Mother    Osteoarthritis Mother    Arthritis Paternal Grandmother     Social History   Socioeconomic History   Marital status: Married    Spouse name: Not on file   Number of children: Not on file   Years of education: Not on file   Highest education level: Not on file  Occupational History   Not on file  Tobacco Use   Smoking status: Never   Smokeless tobacco: Never  Vaping Use   Vaping Use: Never used  Substance and Sexual Activity   Alcohol use: Yes    Alcohol/week: 1.0 - 2.0 standard drink of alcohol    Types:  1 - 2 Standard drinks or equivalent per week   Drug use: No   Sexual activity: Yes    Partners: Male    Birth control/protection: I.U.D.    Comment: Mirena inserted 02-12-15  Other Topics Concern   Not on file  Social History Narrative   Not on file   Social Determinants of Health   Financial Resource Strain: Not on file  Food Insecurity: Not on file  Transportation Needs: Not on file  Physical Activity: Not on file  Stress: Not on file  Social Connections: Not on file  Intimate Partner Violence: Not on file    Review of Systems  All other systems reviewed and are negative.   PHYSICAL EXAMINATION:    BP 100/62   Ht 5\' 6"  (1.676 m)   Wt 155 lb (70.3 kg)   BMI 25.02 kg/m     General appearance: alert, cooperative and appears stated age   ASSESSMENT  Menopausal symptoms.  HRT with Mirena IUD and Vivelle Dot. Depressed mood.  PLAN  Refill of Vivelle Dot 0.05 mg, #24, RF 2. Increase to Wellbutrin XL 300 mg daily, #90, RF 3.  Fu for annual  exam and prn.    An After Visit Summary was printed and given to the patient.  20 min  total time was spent for this patient encounter, including preparation, face-to-face counseling with the patient, coordination of care, and documentation of the encounter.

## 2022-04-03 ENCOUNTER — Ambulatory Visit (INDEPENDENT_AMBULATORY_CARE_PROVIDER_SITE_OTHER): Payer: BC Managed Care – PPO | Admitting: Obstetrics and Gynecology

## 2022-04-03 ENCOUNTER — Encounter: Payer: Self-pay | Admitting: Obstetrics and Gynecology

## 2022-04-03 VITALS — BP 100/62 | Ht 66.0 in | Wt 155.0 lb

## 2022-04-03 DIAGNOSIS — Z79890 Hormone replacement therapy (postmenopausal): Secondary | ICD-10-CM

## 2022-04-03 DIAGNOSIS — R4589 Other symptoms and signs involving emotional state: Secondary | ICD-10-CM | POA: Diagnosis not present

## 2022-04-03 DIAGNOSIS — N951 Menopausal and female climacteric states: Secondary | ICD-10-CM | POA: Diagnosis not present

## 2022-04-03 MED ORDER — ESTRADIOL 0.05 MG/24HR TD PTTW: 1.0000 | MEDICATED_PATCH | TRANSDERMAL | 2 refills | Status: DC

## 2022-04-03 MED ORDER — BUPROPION HCL ER (XL) 300 MG PO TB24: 300.0000 mg | ORAL_TABLET | Freq: Every day | ORAL | 2 refills | Status: DC

## 2022-04-04 ENCOUNTER — Other Ambulatory Visit (HOSPITAL_BASED_OUTPATIENT_CLINIC_OR_DEPARTMENT_OTHER): Payer: Self-pay

## 2022-04-04 ENCOUNTER — Encounter: Payer: Self-pay | Admitting: Obstetrics and Gynecology

## 2022-04-04 MED ORDER — BUPROPION HCL ER (XL) 300 MG PO TB24
300.0000 mg | ORAL_TABLET | Freq: Every day | ORAL | 0 refills | Status: DC
  Filled 2022-04-04: qty 14, 14d supply, fill #0

## 2022-04-04 MED ORDER — ESTRADIOL 0.05 MG/24HR TD PTTW
1.0000 | MEDICATED_PATCH | TRANSDERMAL | 0 refills | Status: DC
  Filled 2022-04-04: qty 8, 28d supply, fill #0

## 2022-04-07 ENCOUNTER — Other Ambulatory Visit (HOSPITAL_BASED_OUTPATIENT_CLINIC_OR_DEPARTMENT_OTHER): Payer: Self-pay

## 2022-04-08 ENCOUNTER — Other Ambulatory Visit (HOSPITAL_BASED_OUTPATIENT_CLINIC_OR_DEPARTMENT_OTHER): Payer: Self-pay

## 2022-04-09 MED ORDER — ESTRADIOL 0.05 MG/24HR TD PTTW: 1.0000 | MEDICATED_PATCH | TRANSDERMAL | 0 refills | Status: DC

## 2022-04-10 ENCOUNTER — Other Ambulatory Visit (HOSPITAL_BASED_OUTPATIENT_CLINIC_OR_DEPARTMENT_OTHER): Payer: Self-pay

## 2022-09-22 ENCOUNTER — Encounter: Payer: Self-pay | Admitting: Obstetrics and Gynecology

## 2022-09-22 DIAGNOSIS — Z1231 Encounter for screening mammogram for malignant neoplasm of breast: Secondary | ICD-10-CM

## 2022-10-03 ENCOUNTER — Other Ambulatory Visit (HOSPITAL_BASED_OUTPATIENT_CLINIC_OR_DEPARTMENT_OTHER): Payer: Self-pay

## 2022-10-14 ENCOUNTER — Other Ambulatory Visit (HOSPITAL_BASED_OUTPATIENT_CLINIC_OR_DEPARTMENT_OTHER): Payer: Self-pay

## 2022-10-14 MED ORDER — COMIRNATY 30 MCG/0.3ML IM SUSY
PREFILLED_SYRINGE | INTRAMUSCULAR | 0 refills | Status: DC
  Filled 2022-10-14: qty 0.3, 1d supply, fill #0

## 2022-10-14 MED ORDER — FLUARIX QUADRIVALENT 0.5 ML IM SUSY
PREFILLED_SYRINGE | INTRAMUSCULAR | 0 refills | Status: DC
  Filled 2022-10-14: qty 0.5, 1d supply, fill #0

## 2022-10-27 ENCOUNTER — Other Ambulatory Visit (HOSPITAL_BASED_OUTPATIENT_CLINIC_OR_DEPARTMENT_OTHER): Payer: Self-pay

## 2022-10-27 ENCOUNTER — Encounter (HOSPITAL_BASED_OUTPATIENT_CLINIC_OR_DEPARTMENT_OTHER): Payer: Self-pay

## 2022-10-27 ENCOUNTER — Ambulatory Visit (HOSPITAL_BASED_OUTPATIENT_CLINIC_OR_DEPARTMENT_OTHER)
Admission: RE | Admit: 2022-10-27 | Discharge: 2022-10-27 | Disposition: A | Discharge status: Home / Self Care | Payer: BC Managed Care – PPO | Source: Ambulatory Visit | Attending: Obstetrics and Gynecology | Admitting: Obstetrics and Gynecology

## 2022-10-27 DIAGNOSIS — Z1231 Encounter for screening mammogram for malignant neoplasm of breast: Secondary | ICD-10-CM | POA: Diagnosis not present

## 2022-11-07 NOTE — Progress Notes (Signed)
52 y.o. [id number] Married Caucasian female here for annual exam.    Notes some increase in heart rate.  Does have some work stress, and she attributes her elevated heart rate to this.  Father dx with afib.   On Vivelle Dot.  Occasional hot flash.  Wants to continue with estrogen therapy.   Taking Wellbutrin.  Feeling more motivated when taking this medication.   Wants to check vit D.   Going a cruise to celebrate 25 years of of marriage.   PCP:  Dr. Fara Olden Sadie Haber   No LMP recorded. (Menstrual status: IUD).           Sexually active: Yes.    The current method of family planning is IUD--Mirena 02/14/20.    Exercising: Yes.     Walking, spin bike Smoker:  no  Health Maintenance: Pap:  09/22/18 neg: HR HPV neg, 10/18/12 neg History of abnormal Pap:  no MMG:  10/27/22 Breast Density Category D, BI-RADS CATEGORY 1 Neg Colonoscopy:  scheduled for March.  BMD:   n/a  Result  n/a TDaP:  07/27/20 Gardasil:   no HIV: neg in preg  Hep C: donates blood Screening Labs:  PCP Did flu vaccine and Covid vaccine   reports that she has never smoked. She has never used smokeless tobacco. She reports current alcohol use of about 1.0 - 2.0 standard drink of alcohol per week. She reports that she does not use drugs.  Past Medical History:  Diagnosis Date   COVID-19 09/2019   IUD (intrauterine device) in place    Vitamin D deficiency     Past Surgical History:  Procedure Laterality Date   DILATION AND CURETTAGE OF UTERUS  2003   MAB   mirena      Current Outpatient Medications  Medication Sig Dispense Refill   buPROPion (WELLBUTRIN XL) 300 MG 24 hr tablet Take 1 tablet (300 mg total) by mouth daily. 90 tablet 2   estradiol (VIVELLE-DOT) 0.05 MG/24HR patch Place 1 patch (0.05 mg total) onto the skin 2 (two) times a week. 24 patch 2   lactobacillus acidophilus & bulgar (LACTINEX) chewable tablet Chew 4 tablets by mouth 4 times a day 200 tablet 7   levonorgestrel (MIRENA) 20  MCG/24HR IUD 1 each by Intrauterine route once.     NONFORMULARY OR COMPOUNDED ITEM Lactinex probiotics.  Take 4 tablets 4 times a day. 4 each 7   No current facility-administered medications for this visit.    Family History  Problem Relation Age of Onset   Arthritis Mother    Osteoarthritis Mother    Arthritis Paternal Grandmother     Review of Systems  All other systems reviewed and are negative.   Exam:   BP 118/80 (BP Location: Left Arm, Patient Position: Sitting, Cuff Size: Normal)   Pulse 90   Ht 5' 6.5" (1.689 m)   Wt 154 lb (69.9 kg)   SpO2 98%   BMI 24.48 kg/m     General appearance: alert, cooperative and appears stated age Head: normocephalic, without obvious abnormality, atraumatic Neck: no adenopathy, supple, symmetrical, trachea midline and thyroid normal to inspection and palpation Lungs: clear to auscultation bilaterally Breasts: normal appearance, no masses or tenderness, No nipple retraction or dimpling, No nipple discharge or bleeding, No axillary adenopathy Heart: regular rate and rhythm Abdomen: soft, non-tender; no masses, no organomegaly Extremities: extremities normal, atraumatic, no cyanosis or edema Skin: skin color, texture, turgor normal. No rashes or lesions Lymph nodes: cervical, supraclavicular, and  axillary nodes normal. Neurologic: grossly normal  Pelvic: External genitalia:  no lesions              No abnormal inguinal nodes palpated.              Urethra:  normal appearing urethra with no masses, tenderness or lesions              Bartholins and Skenes: normal                 Vagina: normal appearing vagina with normal color and discharge, no lesions              Cervix: no lesions.  IUD string noted.               Pap taken: yes Bimanual Exam:  Uterus:  normal size, contour, position, consistency, mobility, non-tender              Adnexa: no mass, fullness, tenderness              Rectal exam: yes.  Confirms.              Anus:   normal sphincter tone, no lesions  Chaperone was present for exam:  Tarry  Assessment:   Well woman visit with gynecologic exam. Mirena IUD patient. HRT.  Mood swings controlled on Wellbutrin.  Low vit D.   Plan: Mammogram screening discussed. Self breast awareness reviewed. Pap and HR HPV as above. Guidelines for Calcium, Vitamin D, regular exercise program including cardiovascular and weight bearing exercise. Rx Vivelle Dot 0.05 mg twice weekly for one year. Rx Wellbutrin XL 300 mg daily.  Refill of probiotics.  Check vit D level.  Remaining screening labs with PCP.  She will see her PCP if her increased heart rate persists.  Follow up annually and prn.   After visit summary provided.

## 2022-11-14 ENCOUNTER — Ambulatory Visit: Payer: BC Managed Care – PPO | Admitting: Obstetrics and Gynecology

## 2022-11-20 ENCOUNTER — Encounter (HOSPITAL_BASED_OUTPATIENT_CLINIC_OR_DEPARTMENT_OTHER): Payer: Self-pay

## 2022-11-20 ENCOUNTER — Other Ambulatory Visit (HOSPITAL_COMMUNITY)
Admission: RE | Admit: 2022-11-20 | Discharge: 2022-11-20 | Disposition: A | Discharge status: Home / Self Care | Payer: BC Managed Care – PPO | Source: Ambulatory Visit | Attending: Obstetrics and Gynecology | Admitting: Obstetrics and Gynecology

## 2022-11-20 ENCOUNTER — Ambulatory Visit (INDEPENDENT_AMBULATORY_CARE_PROVIDER_SITE_OTHER): Payer: BC Managed Care – PPO | Admitting: Obstetrics and Gynecology

## 2022-11-20 ENCOUNTER — Encounter: Payer: Self-pay | Admitting: Obstetrics and Gynecology

## 2022-11-20 ENCOUNTER — Other Ambulatory Visit (HOSPITAL_BASED_OUTPATIENT_CLINIC_OR_DEPARTMENT_OTHER): Payer: Self-pay

## 2022-11-20 VITALS — BP 118/80 | HR 90 | Ht 66.5 in | Wt 154.0 lb

## 2022-11-20 DIAGNOSIS — R7989 Other specified abnormal findings of blood chemistry: Secondary | ICD-10-CM

## 2022-11-20 DIAGNOSIS — E559 Vitamin D deficiency, unspecified: Secondary | ICD-10-CM | POA: Diagnosis not present

## 2022-11-20 DIAGNOSIS — Z79890 Hormone replacement therapy (postmenopausal): Secondary | ICD-10-CM | POA: Diagnosis not present

## 2022-11-20 DIAGNOSIS — Z124 Encounter for screening for malignant neoplasm of cervix: Secondary | ICD-10-CM | POA: Diagnosis not present

## 2022-11-20 DIAGNOSIS — Z01419 Encounter for gynecological examination (general) (routine) without abnormal findings: Secondary | ICD-10-CM | POA: Diagnosis not present

## 2022-11-20 MED ORDER — ESTRADIOL 0.05 MG/24HR TD PTTW: 1.0000 | MEDICATED_PATCH | TRANSDERMAL | 3 refills | Status: DC

## 2022-11-20 MED ORDER — BUPROPION HCL ER (XL) 300 MG PO TB24: 300.0000 mg | ORAL_TABLET | Freq: Every day | ORAL | 3 refills | Status: DC

## 2022-11-20 MED ORDER — LACTINEX PO CHEW
4.0000 | CHEWABLE_TABLET | Freq: Four times a day (QID) | ORAL | 7 refills | Status: DC
  Filled 2022-11-20 (×2): qty 200, 13d supply, fill #0

## 2022-11-20 NOTE — Addendum Note (Signed)
Addended by: Yisroel Ramming, Dietrich Pates E on: 11/20/2022 10:13 AM   Modules accepted: Orders

## 2022-11-20 NOTE — Patient Instructions (Signed)

## 2022-11-21 LAB — VITAMIN D 25 HYDROXY (VIT D DEFICIENCY, FRACTURES): Vit D, 25-Hydroxy: 37 ng/mL (ref 30–100)

## 2022-11-25 LAB — CYTOLOGY - PAP
Comment: NEGATIVE
Diagnosis: NEGATIVE
High risk HPV: NEGATIVE

## 2023-04-02 ENCOUNTER — Other Ambulatory Visit (HOSPITAL_BASED_OUTPATIENT_CLINIC_OR_DEPARTMENT_OTHER): Payer: Self-pay

## 2023-08-18 ENCOUNTER — Other Ambulatory Visit (HOSPITAL_BASED_OUTPATIENT_CLINIC_OR_DEPARTMENT_OTHER): Payer: Self-pay

## 2023-08-18 ENCOUNTER — Other Ambulatory Visit (HOSPITAL_BASED_OUTPATIENT_CLINIC_OR_DEPARTMENT_OTHER): Payer: Self-pay | Admitting: Obstetrics and Gynecology

## 2023-08-18 DIAGNOSIS — Z1231 Encounter for screening mammogram for malignant neoplasm of breast: Secondary | ICD-10-CM

## 2023-08-18 MED ORDER — FLULAVAL 0.5 ML IM SUSY
0.5000 mL | PREFILLED_SYRINGE | Freq: Once | INTRAMUSCULAR | 0 refills | Status: AC
  Filled 2023-08-18: qty 0.5, 1d supply, fill #0

## 2023-10-09 ENCOUNTER — Other Ambulatory Visit (HOSPITAL_BASED_OUTPATIENT_CLINIC_OR_DEPARTMENT_OTHER): Payer: Self-pay

## 2023-11-09 ENCOUNTER — Ambulatory Visit (HOSPITAL_BASED_OUTPATIENT_CLINIC_OR_DEPARTMENT_OTHER)
Admission: RE | Admit: 2023-11-09 | Discharge: 2023-11-09 | Disposition: A | Discharge status: Home / Self Care | Payer: 59 | Source: Ambulatory Visit | Attending: Obstetrics and Gynecology | Admitting: Obstetrics and Gynecology

## 2023-11-09 ENCOUNTER — Inpatient Hospital Stay (HOSPITAL_BASED_OUTPATIENT_CLINIC_OR_DEPARTMENT_OTHER): Admission: RE | Admit: 2023-11-09 | Payer: BC Managed Care – PPO | Source: Ambulatory Visit

## 2023-11-09 ENCOUNTER — Encounter (HOSPITAL_BASED_OUTPATIENT_CLINIC_OR_DEPARTMENT_OTHER): Payer: Self-pay

## 2023-11-09 DIAGNOSIS — Z1231 Encounter for screening mammogram for malignant neoplasm of breast: Secondary | ICD-10-CM | POA: Insufficient documentation

## 2023-11-11 ENCOUNTER — Encounter: Payer: Self-pay | Admitting: Obstetrics and Gynecology

## 2023-12-09 ENCOUNTER — Other Ambulatory Visit: Payer: Self-pay

## 2023-12-09 ENCOUNTER — Other Ambulatory Visit (HOSPITAL_BASED_OUTPATIENT_CLINIC_OR_DEPARTMENT_OTHER): Payer: Self-pay

## 2023-12-09 DIAGNOSIS — Z79890 Hormone replacement therapy (postmenopausal): Secondary | ICD-10-CM

## 2023-12-09 MED ORDER — ESTRADIOL 0.05 MG/24HR TD PTTW
1.0000 | MEDICATED_PATCH | TRANSDERMAL | 0 refills | Status: DC
  Filled 2023-12-09: qty 8, 28d supply, fill #0

## 2023-12-09 NOTE — Telephone Encounter (Signed)
 Med refill request: Estradiol 0.05 mg patch.  Patient requests 1 box for a trip Last AEX: 11/20/22  Next AEX: 02/11/24 Last MMG (if hormonal med) 11/09/23 BI-RADS 1 negative Refill authorized: Estradiol 0.05 mg patch.  Please approve or deny as appropriate.

## 2023-12-21 ENCOUNTER — Other Ambulatory Visit (HOSPITAL_BASED_OUTPATIENT_CLINIC_OR_DEPARTMENT_OTHER): Payer: Self-pay

## 2023-12-31 ENCOUNTER — Other Ambulatory Visit (HOSPITAL_BASED_OUTPATIENT_CLINIC_OR_DEPARTMENT_OTHER): Payer: Self-pay

## 2023-12-31 ENCOUNTER — Other Ambulatory Visit: Payer: Self-pay | Admitting: Obstetrics and Gynecology

## 2023-12-31 ENCOUNTER — Encounter: Payer: Self-pay | Admitting: Obstetrics and Gynecology

## 2023-12-31 DIAGNOSIS — Z79890 Hormone replacement therapy (postmenopausal): Secondary | ICD-10-CM

## 2023-12-31 MED ORDER — ESTRADIOL 0.05 MG/24HR TD PTTW
1.0000 | MEDICATED_PATCH | TRANSDERMAL | 1 refills | Status: DC
  Filled 2023-12-31: qty 8, 28d supply, fill #0
  Filled 2024-02-08: qty 8, 28d supply, fill #1

## 2023-12-31 NOTE — Telephone Encounter (Signed)
 Med refill request: estradiol 0.05 mg patch Last AEX: 11/20/22 Next AEX: 02/11/24 Last MMG (if hormonal med) 11/09/23 BI-RADS 1 negative Refill authorized: estradiol 0.05 mg patch #8 with 1 refill. Please approve or deny as appropriate.

## 2024-02-08 ENCOUNTER — Encounter (HOSPITAL_BASED_OUTPATIENT_CLINIC_OR_DEPARTMENT_OTHER): Payer: Self-pay

## 2024-02-08 ENCOUNTER — Other Ambulatory Visit (HOSPITAL_BASED_OUTPATIENT_CLINIC_OR_DEPARTMENT_OTHER): Payer: Self-pay

## 2024-02-11 ENCOUNTER — Ambulatory Visit (INDEPENDENT_AMBULATORY_CARE_PROVIDER_SITE_OTHER): Payer: BC Managed Care – PPO | Admitting: Obstetrics and Gynecology

## 2024-02-11 ENCOUNTER — Encounter: Payer: Self-pay | Admitting: Obstetrics and Gynecology

## 2024-02-11 ENCOUNTER — Other Ambulatory Visit (HOSPITAL_BASED_OUTPATIENT_CLINIC_OR_DEPARTMENT_OTHER): Payer: Self-pay

## 2024-02-11 VITALS — BP 102/68 | HR 94 | Ht 66.0 in | Wt 160.0 lb

## 2024-02-11 DIAGNOSIS — Z79890 Hormone replacement therapy (postmenopausal): Secondary | ICD-10-CM

## 2024-02-11 DIAGNOSIS — Z1331 Encounter for screening for depression: Secondary | ICD-10-CM

## 2024-02-11 DIAGNOSIS — Z01419 Encounter for gynecological examination (general) (routine) without abnormal findings: Secondary | ICD-10-CM | POA: Diagnosis not present

## 2024-02-11 DIAGNOSIS — Z1211 Encounter for screening for malignant neoplasm of colon: Secondary | ICD-10-CM

## 2024-02-11 DIAGNOSIS — N951 Menopausal and female climacteric states: Secondary | ICD-10-CM

## 2024-02-11 MED ORDER — BUPROPION HCL ER (XL) 300 MG PO TB24: 300.0000 mg | ORAL_TABLET | Freq: Every day | ORAL | 3 refills | Status: DC

## 2024-02-11 MED ORDER — ESTRADIOL 0.05 MG/24HR TD PTTW: 1.0000 | MEDICATED_PATCH | TRANSDERMAL | 3 refills | Status: DC

## 2024-02-11 NOTE — Patient Instructions (Signed)

## 2024-02-11 NOTE — Progress Notes (Signed)
 53 y.o. [id number] Married Caucasian non-Hispanic female here for annual exam.   Some hot flashes that are manageable.  On HRT.   Has some increase in her heart rate.  Thinks this may be related to her hot flashes.  Has felt dizzy.   Doing well on Wellbutrin .  Notes her mood changes are seasonal.   Children are doing well.   PCP: Arva Lathe, MD  No LMP recorded. (Menstrual status: IUD).           Sexually active: Yes.    The current method of family planning is IUD--Mirena  02/14/2020.    Menopausal hormone therapy: Estrogen Patches Exercising: No.   1-2x a week, stairs Smoker:  no  OB History  Gravida Para Term Preterm AB Living  3    1 2   SAB IAB Ectopic Multiple Live Births  1        # Outcome Date GA Lbr Len/2nd Weight Sex Type Anes PTL Lv  3 Gravida           2 Gravida           1 SAB              HEALTH MAINTENANCE: Last 2 paps: 2019-WNL, HPV- neg, 11/20/2022-WNL, HPV- neg History of abnormal Pap or positive HPV: no Mammogram: 11/09/2023-WNL, Cat D Colonoscopy:   NA Bone Density: n/a  Result n/a   Immunization History  Administered Date(s) Administered   Influenza Split 07/15/2012   Influenza, Seasonal, Injecte, Preservative Fre 08/18/2023   Influenza,inj,Quad PF,6+ Mos 07/18/2014, 09/07/2018, 08/21/2019, 10/14/2022   Moderna Sars-Covid-2 Vaccination 01/04/2020, 02/01/2020   Pfizer(Comirnaty )Fall Seasonal Vaccine 12 years and older 10/14/2022   Tdap 12/28/2018, 07/27/2020      reports that she has never smoked. She has never used smokeless tobacco. She reports current alcohol use of about 1.0 - 2.0 standard drink of alcohol per week. She reports that she does not use drugs.  Past Medical History:  Diagnosis Date   COVID-19 09/2019   IUD (intrauterine device) in place    Mirena    Vitamin D  deficiency     Past Surgical History:  Procedure Laterality Date   DILATION AND CURETTAGE OF UTERUS  2003   MAB   mirena       Current Outpatient  Medications  Medication Sig Dispense Refill   buPROPion  (WELLBUTRIN  XL) 300 MG 24 hr tablet Take 1 tablet (300 mg total) by mouth daily. 90 tablet 3   estradiol  (VIVELLE -DOT) 0.05 MG/24HR patch Place 1 patch (0.05 mg total) onto the skin 2 (two) times a week. 8 patch 1   levonorgestrel  (MIRENA ) 20 MCG/24HR IUD 1 each by Intrauterine route once.     No current facility-administered medications for this visit.    ALLERGIES: Patient has no known allergies.  Family History  Problem Relation Age of Onset   Arthritis Mother    Osteoarthritis Mother    Arthritis Paternal Grandmother     Review of Systems  All other systems reviewed and are negative.   PHYSICAL EXAM:  BP 102/68   Pulse 94   Ht 5\' 6"  (1.676 m)   Wt 160 lb (72.6 kg)   SpO2 97%   BMI 25.82 kg/m     General appearance: alert, cooperative and appears stated age Head: normocephalic, without obvious abnormality, atraumatic Neck: no adenopathy, supple, symmetrical, trachea midline and thyroid  normal to inspection and palpation Lungs: clear to auscultation bilaterally Breasts: normal appearance, no masses or tenderness, No nipple retraction or  dimpling, No nipple discharge or bleeding, No axillary adenopathy Heart: regular rate and rhythm Abdomen: soft, non-tender; no masses, no organomegaly Extremities: extremities normal, atraumatic, no cyanosis or edema Skin: skin color, texture, turgor normal. No rashes or lesions Lymph nodes: cervical, supraclavicular, and axillary nodes normal. Neurologic: grossly normal  Pelvic: External genitalia:  no lesions.  4 mm superficial cyst of the perineum, nontender.              No abnormal inguinal nodes palpated.              Urethra:  normal appearing urethra with no masses, tenderness or lesions              Bartholins and Skenes: normal                 Vagina: normal appearing vagina with normal color and discharge, no lesions              Cervix: no lesions.  IUD strings noted.                Pap taken: no.   Bimanual Exam:  Uterus:  normal size, contour, position, consistency, mobility, non-tender              Adnexa: no mass, fullness, tenderness              Rectal exam: yes.  Confirms.              Anus:  normal sphincter tone, no lesions  Chaperone was present for exam:  Alesia Husky, CMA  ASSESSMENT: Well woman visit with gynecologic exam. Mirena  IUD patient. HRT.  Mirena  and transdermal estradiol .  Mood swings controlled on Wellbutrin .  Low vit D.  Normalized.  PHQ-9: 0 Colon cancer screening.  PLAN: Mammogram screening discussed. Self breast awareness reviewed. Pap and HRV collected:  no.  Due in 2029.  Guidelines for Calcium, Vitamin D , regular exercise program including cardiovascular and weight bearing exercise. Discused WHI and use of HRT which can increase risk of PE, DVT, MI, stroke and breast cancer.  Medication refills:  Vivelle  Dot 0.05 mg twice weekly.  #24, RF 3, Wellbutrin  XL 300 mg daily, #90, RF 3.  FSH.  Cologuard.  Follow up:  yearly and prn.

## 2024-02-12 ENCOUNTER — Other Ambulatory Visit (HOSPITAL_BASED_OUTPATIENT_CLINIC_OR_DEPARTMENT_OTHER): Payer: Self-pay

## 2024-02-12 LAB — FOLLICLE STIMULATING HORMONE: FSH: 54.3 m[IU]/mL

## 2024-02-15 ENCOUNTER — Encounter: Payer: Self-pay | Admitting: Obstetrics and Gynecology

## 2024-04-20 ENCOUNTER — Other Ambulatory Visit (HOSPITAL_BASED_OUTPATIENT_CLINIC_OR_DEPARTMENT_OTHER): Payer: Self-pay

## 2024-04-20 MED ORDER — GEL-KAM 0.4 % DT GEL
Freq: Every day | DENTAL | 0 refills | Status: AC
  Filled 2024-04-20: qty 122, 30d supply, fill #0

## 2024-04-21 ENCOUNTER — Other Ambulatory Visit (HOSPITAL_BASED_OUTPATIENT_CLINIC_OR_DEPARTMENT_OTHER): Payer: Self-pay

## 2024-04-27 ENCOUNTER — Other Ambulatory Visit: Payer: Self-pay

## 2024-04-27 DIAGNOSIS — F4323 Adjustment disorder with mixed anxiety and depressed mood: Secondary | ICD-10-CM

## 2024-04-27 DIAGNOSIS — Z79890 Hormone replacement therapy (postmenopausal): Secondary | ICD-10-CM

## 2024-04-27 MED ORDER — BUPROPION HCL ER (XL) 300 MG PO TB24: 300.0000 mg | ORAL_TABLET | Freq: Every day | ORAL | 2 refills | Status: AC

## 2024-04-27 MED ORDER — ESTRADIOL 0.05 MG/24HR TD PTTW: 1.0000 | MEDICATED_PATCH | TRANSDERMAL | 2 refills | Status: DC

## 2024-04-27 NOTE — Telephone Encounter (Signed)
.  Med refill request: Bupropion  300 MG XL (24HR) and Estradiol  2xweekly 0.05 patch  Last AEX: 02/11/24 Next AEX:12/06/24 Last MMG (if hormonal med) Refill authorized: Please Advise?    Pt changed pharmacies.

## 2024-05-03 ENCOUNTER — Other Ambulatory Visit (HOSPITAL_BASED_OUTPATIENT_CLINIC_OR_DEPARTMENT_OTHER): Payer: Self-pay

## 2024-05-06 ENCOUNTER — Telehealth: Payer: Self-pay

## 2024-05-06 NOTE — Telephone Encounter (Signed)
 PA was started and sent to plan on 05/06/24 via CoverMyMeds for estradiol  0.05mg  patches. Message received back stating that patient needs to contact her insurance company to provide additional information. Patient has been made aware.  Key: [id number]  Message we received back as follows : Additional Information Required Please have the patient contact the member services number located on the back of their card.

## 2024-05-12 ENCOUNTER — Telehealth: Payer: Self-pay | Admitting: Obstetrics and Gynecology

## 2024-05-12 NOTE — Telephone Encounter (Signed)
 Please contact patient to complete Cologuard.   Let me know if she wishes for me to cancel the test.

## 2024-05-16 NOTE — Telephone Encounter (Signed)
 Call placed to patient, left detailed message, ok per dpr. Advised per Dr. Nikki. Return call to [phone], opt 4, to advise how you want to proceed.

## 2024-05-24 ENCOUNTER — Other Ambulatory Visit: Payer: Self-pay | Admitting: Obstetrics and Gynecology

## 2024-05-24 DIAGNOSIS — Z79890 Hormone replacement therapy (postmenopausal): Secondary | ICD-10-CM

## 2024-05-24 NOTE — Telephone Encounter (Signed)
 Med refill request: estradiol  0.05 mg patches (1 patch/2 times weekly) Last AEX:  02/11/24 Next AEX:  05/31/24 Last MMG (if hormonal med): 11/10/23 Last Rx sent: 04/28/24 - #24 patches with 2 refills. Please approve or deny.  Refill authorized? Please advise.

## 2024-06-07 NOTE — Telephone Encounter (Signed)
Call placed to patient, left detailed message, ok per dpr.  Advised per Dr. Edward Jolly. Return call to office at [phone], option 4,  to advise how you would like to proceed.

## 2024-06-23 NOTE — Telephone Encounter (Signed)
 Encounter reviewed and closed.

## 2024-06-23 NOTE — Telephone Encounter (Signed)
 Detailed message x2.   AEX scheduled for 12/06/24.   No response from patient.   Routing to Dr. Nikki

## 2024-12-06 ENCOUNTER — Ambulatory Visit: Admitting: Obstetrics and Gynecology
